# Patient Record
Sex: Female | Born: 1955
Health system: Southern US, Community
[De-identification: ages and names within clinical notes are randomized; demographics above are authoritative.]

---

## 2010-12-05 ENCOUNTER — Ambulatory Visit: Payer: Self-pay | Admitting: Unknown Physician Specialty

## 2010-12-14 ENCOUNTER — Ambulatory Visit: Payer: Self-pay | Admitting: Unknown Physician Specialty

## 2010-12-14 ENCOUNTER — Ambulatory Visit: Payer: Self-pay | Admitting: Oncology

## 2010-12-15 LAB — PATHOLOGY REPORT

## 2010-12-18 ENCOUNTER — Ambulatory Visit: Payer: Self-pay | Admitting: Oncology

## 2010-12-20 ENCOUNTER — Ambulatory Visit: Payer: Self-pay | Admitting: Oncology

## 2011-01-02 ENCOUNTER — Ambulatory Visit: Payer: Self-pay | Admitting: Oncology

## 2011-01-04 ENCOUNTER — Ambulatory Visit: Payer: Self-pay

## 2011-01-10 ENCOUNTER — Ambulatory Visit: Payer: Self-pay | Admitting: Surgery

## 2011-01-16 ENCOUNTER — Ambulatory Visit: Payer: Self-pay | Admitting: Surgery

## 2011-01-18 LAB — COMPREHENSIVE METABOLIC PANEL
Albumin: 4 g/dL (ref 3.4–5.0)
BUN: 9 mg/dL (ref 7–18)
Bilirubin,Total: 0.3 mg/dL (ref 0.2–1.0)
Chloride: 104 mmol/L (ref 98–107)
EGFR (African American): >60
Potassium: 3.6 mmol/L (ref 3.5–5.1)
SGOT(AST): 17 U/L (ref 15–37)
SGPT (ALT): 23 U/L
Sodium: 142 mmol/L (ref 136–145)
Total Protein: 7.4 g/dL (ref 6.4–8.2)

## 2011-01-18 LAB — CBC CANCER CENTER
Basophil %: 0.5 %
Eosinophil %: 1 %
HCT: 32.2 % — ABNORMAL LOW (ref 35.0–47.0)
HGB: 10.6 g/dL — ABNORMAL LOW (ref 12.0–16.0)
Lymphocyte #: 1.7 x10 3/mm (ref 1.0–3.6)
MCH: 25.2 pg — ABNORMAL LOW (ref 26.0–34.0)
MCV: 77 fL — ABNORMAL LOW (ref 80–100)
Monocyte #: 0.3 x10 3/mm (ref 0.0–0.7)
Neutrophil #: 2.1 x10 3/mm (ref 1.4–6.5)
RBC: 4.2 10*6/uL (ref 3.80–5.20)
WBC: 4.1 x10 3/mm (ref 3.6–11.0)

## 2011-01-25 LAB — CBC CANCER CENTER
Basophil %: 0.2 %
Eosinophil %: 2 %
HGB: 10.4 g/dL — ABNORMAL LOW (ref 12.0–16.0)
Lymphocyte #: 0.9 x10 3/mm — ABNORMAL LOW (ref 1.0–3.6)
MCH: 25 pg — ABNORMAL LOW (ref 26.0–34.0)
MCV: 76 fL — ABNORMAL LOW (ref 80–100)
Monocyte #: 0.2 x10 3/mm (ref 0.0–0.7)
RBC: 4.16 10*6/uL (ref 3.80–5.20)
RDW: 14.5 % (ref 11.5–14.5)

## 2011-01-31 LAB — CBC CANCER CENTER
Basophil %: 0.3 %
Eosinophil %: 4.7 %
Lymphocyte %: 22.1 %
MCH: 25.8 pg — ABNORMAL LOW (ref 26.0–34.0)
Monocyte %: 6.9 %
Neutrophil %: 66 %
RBC: 4.17 10*6/uL (ref 3.80–5.20)

## 2011-02-02 ENCOUNTER — Ambulatory Visit: Payer: Self-pay | Admitting: Oncology

## 2011-02-05 LAB — COMPREHENSIVE METABOLIC PANEL
Albumin: 3.7 g/dL (ref 3.4–5.0)
Anion Gap: 9 (ref 7–16)
BUN: 11 mg/dL (ref 7–18)
Bilirubin,Total: 0.3 mg/dL (ref 0.2–1.0)
Chloride: 101 mmol/L (ref 98–107)
Creatinine: 0.67 mg/dL (ref 0.60–1.30)
EGFR (African American): >60
Osmolality: 279 (ref 275–301)
Potassium: 3.8 mmol/L (ref 3.5–5.1)
SGPT (ALT): 32 U/L
Sodium: 140 mmol/L (ref 136–145)
Total Protein: 7.4 g/dL (ref 6.4–8.2)

## 2011-02-05 LAB — CBC CANCER CENTER
Basophil #: 0 x10 3/mm (ref 0.0–0.1)
Eosinophil %: 9.4 %
HCT: 32 % — ABNORMAL LOW (ref 35.0–47.0)
Lymphocyte #: 0.6 x10 3/mm — ABNORMAL LOW (ref 1.0–3.6)
Lymphocyte %: 13.2 %
MCV: 77 fL — ABNORMAL LOW (ref 80–100)
Monocyte %: 5.2 %
Neutrophil #: 3.4 x10 3/mm (ref 1.4–6.5)
Neutrophil %: 72 %
Platelet: 208 x10 3/mm (ref 150–440)
RBC: 4.17 10*6/uL (ref 3.80–5.20)
RDW: 14.8 % — ABNORMAL HIGH (ref 11.5–14.5)

## 2011-02-12 LAB — CBC CANCER CENTER
Basophil #: 0 x10 3/mm (ref 0.0–0.1)
Basophil %: 0.3 %
Eosinophil #: 0.6 x10 3/mm (ref 0.0–0.7)
HCT: 30.8 % — ABNORMAL LOW (ref 35.0–47.0)
HGB: 10.1 g/dL — ABNORMAL LOW (ref 12.0–16.0)
Lymphocyte #: 0.6 x10 3/mm — ABNORMAL LOW (ref 1.0–3.6)
MCH: 25.3 pg — ABNORMAL LOW (ref 26.0–34.0)
MCHC: 32.9 g/dL (ref 32.0–36.0)
MCV: 77 fL — ABNORMAL LOW (ref 80–100)
Monocyte #: 0.3 x10 3/mm (ref 0.0–0.7)
Neutrophil #: 2.9 x10 3/mm (ref 1.4–6.5)
Neutrophil %: 66.4 %
RDW: 15.2 % — ABNORMAL HIGH (ref 11.5–14.5)

## 2011-02-12 LAB — BASIC METABOLIC PANEL
BUN: 9 mg/dL (ref 7–18)
Calcium, Total: 8.6 mg/dL (ref 8.5–10.1)
Creatinine: 0.67 mg/dL (ref 0.60–1.30)
EGFR (African American): >60
EGFR (Non-African Amer.): >60
Glucose: 93 mg/dL (ref 65–99)
Potassium: 3.8 mmol/L (ref 3.5–5.1)
Sodium: 140 mmol/L (ref 136–145)

## 2011-02-19 LAB — CBC CANCER CENTER
Basophil %: 0.1 %
Eosinophil #: 0.6 x10 3/mm (ref 0.0–0.7)
Eosinophil %: 15.8 %
HCT: 32 % — ABNORMAL LOW (ref 35.0–47.0)
HGB: 10.6 g/dL — ABNORMAL LOW (ref 12.0–16.0)
Lymphocyte %: 10.5 %
MCV: 77 fL — ABNORMAL LOW (ref 80–100)
Monocyte %: 6.9 %
Neutrophil #: 2.4 x10 3/mm (ref 1.4–6.5)
Neutrophil %: 66.7 %
Platelet: 246 x10 3/mm (ref 150–440)
RBC: 4.18 10*6/uL (ref 3.80–5.20)
WBC: 3.5 x10 3/mm — ABNORMAL LOW (ref 3.6–11.0)

## 2011-02-22 LAB — CBC CANCER CENTER
Basophil #: 0 x10 3/mm (ref 0.0–0.1)
Lymphocyte #: 0.3 x10 3/mm — ABNORMAL LOW (ref 1.0–3.6)
MCV: 77 fL — ABNORMAL LOW (ref 80–100)
Monocyte #: 0.4 x10 3/mm (ref 0.0–0.7)
Monocyte %: 6.7 %
Neutrophil %: 78.5 %
Platelet: 230 x10 3/mm (ref 150–440)
RBC: 4.07 10*6/uL (ref 3.80–5.20)
RDW: 16 % — ABNORMAL HIGH (ref 11.5–14.5)
WBC: 5.3 x10 3/mm (ref 3.6–11.0)

## 2011-02-26 LAB — COMPREHENSIVE METABOLIC PANEL
Alkaline Phosphatase: 69 U/L (ref 50–136)
Anion Gap: 8 (ref 7–16)
BUN: 8 mg/dL (ref 7–18)
Calcium, Total: 8.7 mg/dL (ref 8.5–10.1)
Chloride: 102 mmol/L (ref 98–107)
Co2: 31 mmol/L (ref 21–32)
Creatinine: 0.65 mg/dL (ref 0.60–1.30)
EGFR (African American): >60
EGFR (Non-African Amer.): >60
Osmolality: 280 (ref 275–301)
SGOT(AST): 12 U/L — ABNORMAL LOW (ref 15–37)
SGPT (ALT): 20 U/L
Sodium: 141 mmol/L (ref 136–145)

## 2011-02-26 LAB — CBC CANCER CENTER
Basophil %: 0.1 %
Eosinophil #: 0.3 x10 3/mm (ref 0.0–0.7)
Eosinophil %: 7.8 %
HGB: 10.2 g/dL — ABNORMAL LOW (ref 12.0–16.0)
Lymphocyte %: 7.5 %
MCH: 25.4 pg — ABNORMAL LOW (ref 26.0–34.0)
MCV: 77 fL — ABNORMAL LOW (ref 80–100)
Monocyte #: 0.5 x10 3/mm (ref 0.0–0.7)
Monocyte %: 11.4 %
Neutrophil %: 73.2 %
Platelet: 227 x10 3/mm (ref 150–440)
RBC: 4.04 10*6/uL (ref 3.80–5.20)
WBC: 4.1 x10 3/mm (ref 3.6–11.0)

## 2011-03-02 ENCOUNTER — Ambulatory Visit: Payer: Self-pay | Admitting: Oncology

## 2011-03-26 LAB — COMPREHENSIVE METABOLIC PANEL
Albumin: 3.8 g/dL (ref 3.4–5.0)
Alkaline Phosphatase: 62 U/L (ref 50–136)
BUN: 13 mg/dL (ref 7–18)
Bilirubin,Total: 0.3 mg/dL (ref 0.2–1.0)
Calcium, Total: 9 mg/dL (ref 8.5–10.1)
Co2: 30 mmol/L (ref 21–32)
EGFR (African American): >60
Osmolality: 281 (ref 275–301)
Potassium: 3.8 mmol/L (ref 3.5–5.1)
SGPT (ALT): 32 U/L
Sodium: 141 mmol/L (ref 136–145)
Total Protein: 7.5 g/dL (ref 6.4–8.2)

## 2011-03-26 LAB — CBC CANCER CENTER
Eosinophil #: 0.3 x10 3/mm (ref 0.0–0.7)
HCT: 31.3 % — ABNORMAL LOW (ref 35.0–47.0)
HGB: 10.4 g/dL — ABNORMAL LOW (ref 12.0–16.0)
Lymphocyte #: 0.6 x10 3/mm — ABNORMAL LOW (ref 1.0–3.6)
MCH: 25.7 pg — ABNORMAL LOW (ref 26.0–34.0)
Neutrophil %: 63.4 %
RBC: 4.02 10*6/uL (ref 3.80–5.20)
RDW: 16.3 % — ABNORMAL HIGH (ref 11.5–14.5)
WBC: 3.2 x10 3/mm — ABNORMAL LOW (ref 3.6–11.0)

## 2011-03-27 LAB — CEA: CEA: 2.9 ng/mL (ref 0.0–4.7)

## 2011-04-02 ENCOUNTER — Ambulatory Visit: Payer: Self-pay | Admitting: Oncology

## 2011-05-02 ENCOUNTER — Ambulatory Visit: Payer: Self-pay | Admitting: Oncology

## 2011-05-15 ENCOUNTER — Ambulatory Visit: Payer: Self-pay | Admitting: Surgery

## 2011-05-15 LAB — CBC WITH DIFFERENTIAL/PLATELET
Basophil #: 0 10*3/uL (ref 0.0–0.1)
Eosinophil #: 0.1 10*3/uL (ref 0.0–0.7)
Eosinophil %: 2.1 %
HCT: 33.6 % — ABNORMAL LOW (ref 35.0–47.0)
Lymphocyte %: 20.4 %
MCH: 25.7 pg — ABNORMAL LOW (ref 26.0–34.0)
Monocyte #: 0.2 x10 3/mm (ref 0.2–0.9)
Neutrophil %: 70 %
Platelet: 231 10*3/uL (ref 150–440)
RBC: 4.2 10*6/uL (ref 3.80–5.20)
RDW: 15 % — ABNORMAL HIGH (ref 11.5–14.5)
WBC: 3 10*3/uL — ABNORMAL LOW (ref 3.6–11.0)

## 2011-05-15 LAB — BASIC METABOLIC PANEL
Anion Gap: 6 — ABNORMAL LOW (ref 7–16)
BUN: 14 mg/dL (ref 7–18)
Calcium, Total: 8.8 mg/dL (ref 8.5–10.1)
Chloride: 105 mmol/L (ref 98–107)
Co2: 30 mmol/L (ref 21–32)
Creatinine: 0.63 mg/dL (ref 0.60–1.30)
EGFR (African American): >60
EGFR (Non-African Amer.): >60
Glucose: 86 mg/dL (ref 65–99)
Osmolality: 281 (ref 275–301)
Potassium: 4.1 mmol/L (ref 3.5–5.1)
Sodium: 141 mmol/L (ref 136–145)

## 2011-05-15 LAB — APTT: Activated PTT: 30.7 secs (ref 23.6–35.9)

## 2011-05-15 LAB — HEPATIC FUNCTION PANEL A (ARMC)
Albumin: 4 g/dL (ref 3.4–5.0)
Alkaline Phosphatase: 53 U/L (ref 50–136)
Bilirubin,Total: 0.3 mg/dL (ref 0.2–1.0)
SGOT(AST): 24 U/L (ref 15–37)
SGPT (ALT): 29 U/L
Total Protein: 7.6 g/dL (ref 6.4–8.2)

## 2011-05-15 LAB — PROTIME-INR: INR: 0.9

## 2011-05-22 ENCOUNTER — Inpatient Hospital Stay: Payer: Self-pay | Admitting: Surgery

## 2011-05-23 LAB — CBC WITH DIFFERENTIAL/PLATELET
Basophil %: 0.1 %
Eosinophil #: 0 10*3/uL (ref 0.0–0.7)
Eosinophil %: 0 %
Lymphocyte #: 0.3 10*3/uL — ABNORMAL LOW (ref 1.0–3.6)
Lymphocyte %: 7.5 %
MCHC: 33 g/dL (ref 32.0–36.0)
MCV: 79 fL — ABNORMAL LOW (ref 80–100)
Neutrophil #: 3.9 10*3/uL (ref 1.4–6.5)
Neutrophil %: 84.4 %
Platelet: 217 10*3/uL (ref 150–440)
RBC: 3.64 10*6/uL — ABNORMAL LOW (ref 3.80–5.20)
RDW: 14.4 % (ref 11.5–14.5)

## 2011-05-23 LAB — COMPREHENSIVE METABOLIC PANEL
Albumin: 3.1 g/dL — ABNORMAL LOW (ref 3.4–5.0)
Alkaline Phosphatase: 42 U/L — ABNORMAL LOW (ref 50–136)
Anion Gap: 9 (ref 7–16)
BUN: 9 mg/dL (ref 7–18)
Bilirubin,Total: 0.5 mg/dL (ref 0.2–1.0)
Calcium, Total: 8.1 mg/dL — ABNORMAL LOW (ref 8.5–10.1)
Co2: 24 mmol/L (ref 21–32)
Creatinine: 0.93 mg/dL (ref 0.60–1.30)
EGFR (African American): >60
EGFR (Non-African Amer.): >60
Glucose: 124 mg/dL — ABNORMAL HIGH (ref 65–99)
Osmolality: 281 (ref 275–301)
Potassium: 4.1 mmol/L (ref 3.5–5.1)
SGOT(AST): 19 U/L (ref 15–37)
Sodium: 141 mmol/L (ref 136–145)
Total Protein: 6.1 g/dL — ABNORMAL LOW (ref 6.4–8.2)

## 2011-05-26 LAB — CBC WITH DIFFERENTIAL/PLATELET
Basophil #: 0 10*3/uL (ref 0.0–0.1)
Basophil %: 0.6 %
HCT: 26 % — ABNORMAL LOW (ref 35.0–47.0)
HGB: 8.3 g/dL — ABNORMAL LOW (ref 12.0–16.0)
Lymphocyte #: 0.5 10*3/uL — ABNORMAL LOW (ref 1.0–3.6)
Lymphocyte %: 18.3 %
MCH: 25.4 pg — ABNORMAL LOW (ref 26.0–34.0)
MCHC: 31.9 g/dL — ABNORMAL LOW (ref 32.0–36.0)
Monocyte %: 8.2 %
Neutrophil %: 68.5 %
Platelet: 197 10*3/uL (ref 150–440)
WBC: 2.6 10*3/uL — ABNORMAL LOW (ref 3.6–11.0)

## 2011-05-28 LAB — CREATININE, SERUM
EGFR (African American): >60
EGFR (Non-African Amer.): >60

## 2011-05-28 LAB — PATHOLOGY REPORT

## 2011-06-14 ENCOUNTER — Ambulatory Visit: Payer: Self-pay | Admitting: Oncology

## 2011-06-19 LAB — COMPREHENSIVE METABOLIC PANEL
Albumin: 4.2 g/dL (ref 3.4–5.0)
Alkaline Phosphatase: 67 U/L (ref 50–136)
Anion Gap: 8 (ref 7–16)
BUN: 26 mg/dL — ABNORMAL HIGH (ref 7–18)
Calcium, Total: 8.9 mg/dL (ref 8.5–10.1)
Chloride: 91 mmol/L — ABNORMAL LOW (ref 98–107)
Co2: 21 mmol/L (ref 21–32)
Osmolality: 247 (ref 275–301)
Potassium: 5.6 mmol/L — ABNORMAL HIGH (ref 3.5–5.1)
SGOT(AST): 14 U/L — ABNORMAL LOW (ref 15–37)
SGPT (ALT): 20 U/L

## 2011-06-19 LAB — CBC CANCER CENTER
Basophil #: 0 x10 3/mm (ref 0.0–0.1)
Basophil %: 0.8 %
Eosinophil %: 5.4 %
HGB: 9.3 g/dL — ABNORMAL LOW (ref 12.0–16.0)
Lymphocyte %: 21.7 %
MCHC: 32.6 g/dL (ref 32.0–36.0)
MCV: 76 fL — ABNORMAL LOW (ref 80–100)
Monocyte #: 0.3 x10 3/mm (ref 0.2–0.9)
Monocyte %: 8.5 %
Neutrophil #: 1.9 x10 3/mm (ref 1.4–6.5)
Platelet: 266 x10 3/mm (ref 150–440)
RDW: 13.5 % (ref 11.5–14.5)
WBC: 3 x10 3/mm — ABNORMAL LOW (ref 3.6–11.0)

## 2011-06-20 LAB — CEA: CEA: 1.9 ng/mL (ref 0.0–4.7)

## 2011-06-26 LAB — COMPREHENSIVE METABOLIC PANEL
Albumin: 4.2 g/dL (ref 3.4–5.0)
Alkaline Phosphatase: 60 U/L (ref 50–136)
Anion Gap: 10 (ref 7–16)
BUN: 20 mg/dL — ABNORMAL HIGH (ref 7–18)
Bilirubin,Total: 0.2 mg/dL (ref 0.2–1.0)
Chloride: 94 mmol/L — ABNORMAL LOW (ref 98–107)
Co2: 21 mmol/L (ref 21–32)
Creatinine: 1.22 mg/dL (ref 0.60–1.30)
EGFR (Non-African Amer.): 51 — ABNORMAL LOW
Osmolality: 254 (ref 275–301)
Potassium: 4.8 mmol/L (ref 3.5–5.1)
SGPT (ALT): 20 U/L
Total Protein: 8 g/dL (ref 6.4–8.2)

## 2011-06-26 LAB — CBC CANCER CENTER
Basophil #: 0 x10 3/mm (ref 0.0–0.1)
Lymphocyte #: 0.7 x10 3/mm — ABNORMAL LOW (ref 1.0–3.6)
Lymphocyte %: 23.8 %
MCH: 25 pg — ABNORMAL LOW (ref 26.0–34.0)
MCHC: 32.2 g/dL (ref 32.0–36.0)
Monocyte #: 0.2 x10 3/mm (ref 0.2–0.9)
Neutrophil #: 1.8 x10 3/mm (ref 1.4–6.5)
Platelet: 229 x10 3/mm (ref 150–440)
RBC: 3.51 10*6/uL — ABNORMAL LOW (ref 3.80–5.20)
RDW: 13.4 % (ref 11.5–14.5)

## 2011-07-02 ENCOUNTER — Ambulatory Visit: Payer: Self-pay | Admitting: Oncology

## 2011-07-05 ENCOUNTER — Inpatient Hospital Stay: Payer: Self-pay | Admitting: Family Medicine

## 2011-07-05 LAB — CBC WITH DIFFERENTIAL/PLATELET
Basophil %: 0.2 %
Eosinophil #: 0.1 10*3/uL (ref 0.0–0.7)
Eosinophil %: 2.1 %
HCT: 30.4 % — ABNORMAL LOW (ref 35.0–47.0)
HGB: 10.4 g/dL — ABNORMAL LOW (ref 12.0–16.0)
Lymphocyte %: 18.6 %
MCH: 25.2 pg — ABNORMAL LOW (ref 26.0–34.0)
MCHC: 34.1 g/dL (ref 32.0–36.0)
Monocyte #: 0.2 x10 3/mm (ref 0.2–0.9)
Monocyte %: 4.5 %
Neutrophil #: 3.2 10*3/uL (ref 1.4–6.5)
Neutrophil %: 74.6 %
Platelet: 333 10*3/uL (ref 150–440)
RBC: 4.12 10*6/uL (ref 3.80–5.20)

## 2011-07-05 LAB — BASIC METABOLIC PANEL
BUN: 16 mg/dL (ref 7–18)
Calcium, Total: 8.4 mg/dL — ABNORMAL LOW (ref 8.5–10.1)
EGFR (African American): 52 — ABNORMAL LOW
EGFR (Non-African Amer.): 45 — ABNORMAL LOW
Glucose: 117 mg/dL — ABNORMAL HIGH (ref 65–99)
Osmolality: 256 (ref 275–301)
Sodium: 126 mmol/L — ABNORMAL LOW (ref 136–145)

## 2011-07-05 LAB — COMPREHENSIVE METABOLIC PANEL
Albumin: 5 g/dL (ref 3.4–5.0)
Anion Gap: 11 (ref 7–16)
BUN: 23 mg/dL — ABNORMAL HIGH (ref 7–18)
Bilirubin,Total: 0.4 mg/dL (ref 0.2–1.0)
Calcium, Total: 9.6 mg/dL (ref 8.5–10.1)
Creatinine: 1.64 mg/dL — ABNORMAL HIGH (ref 0.60–1.30)
EGFR (Non-African Amer.): 35 — ABNORMAL LOW
Glucose: 96 mg/dL (ref 65–99)
Osmolality: 236 (ref 275–301)
Potassium: 5 mmol/L (ref 3.5–5.1)
SGPT (ALT): 24 U/L
Sodium: 115 mmol/L — CL (ref 136–145)
Total Protein: 9.2 g/dL — ABNORMAL HIGH (ref 6.4–8.2)

## 2011-07-05 LAB — URINALYSIS, COMPLETE
Ketone: NEGATIVE
Nitrite: NEGATIVE
Ph: 6 (ref 4.5–8.0)
Protein: NEGATIVE
RBC,UR: <1 /HPF (ref 0–5)
Squamous Epithelial: <1

## 2011-07-05 LAB — MAGNESIUM: Magnesium: 1.4 mg/dL — ABNORMAL LOW

## 2011-07-05 LAB — SODIUM: Sodium: 130 mmol/L — ABNORMAL LOW (ref 136–145)

## 2011-07-05 LAB — LIPASE, BLOOD: Lipase: 488 U/L — ABNORMAL HIGH (ref 73–393)

## 2011-07-06 LAB — CBC WITH DIFFERENTIAL/PLATELET
Basophil %: 0.6 %
Eosinophil #: 0 10*3/uL (ref 0.0–0.7)
HGB: 7 g/dL — ABNORMAL LOW (ref 12.0–16.0)
Lymphocyte %: 24.1 %
MCV: 77 fL — ABNORMAL LOW (ref 80–100)
Monocyte #: 0.2 x10 3/mm (ref 0.2–0.9)
Neutrophil %: 62.1 %
Platelet: 246 10*3/uL (ref 150–440)
RBC: 2.72 10*6/uL — ABNORMAL LOW (ref 3.80–5.20)
WBC: 1.8 10*3/uL — CL (ref 3.6–11.0)

## 2011-07-06 LAB — BASIC METABOLIC PANEL
Anion Gap: 9 (ref 7–16)
Calcium, Total: 7.5 mg/dL — ABNORMAL LOW (ref 8.5–10.1)
Co2: 21 mmol/L (ref 21–32)
EGFR (African American): >60
EGFR (Non-African Amer.): >60
Glucose: 75 mg/dL (ref 65–99)
Osmolality: 275 (ref 275–301)
Sodium: 139 mmol/L (ref 136–145)

## 2011-07-06 LAB — HEMOGLOBIN: HGB: 6.8 g/dL — ABNORMAL LOW (ref 12.0–16.0)

## 2011-07-06 LAB — LIPASE, BLOOD: Lipase: 373 U/L (ref 73–393)

## 2011-07-07 LAB — CBC WITH DIFFERENTIAL/PLATELET
Basophil #: 0 10*3/uL (ref 0.0–0.1)
HCT: 25.9 % — ABNORMAL LOW (ref 35.0–47.0)
Lymphocyte #: 0.4 10*3/uL — ABNORMAL LOW (ref 1.0–3.6)
Lymphocyte %: 20.3 %
MCH: 26.2 pg (ref 26.0–34.0)
MCHC: 33.5 g/dL (ref 32.0–36.0)
MCV: 78 fL — ABNORMAL LOW (ref 80–100)
Monocyte #: 0.3 x10 3/mm (ref 0.2–0.9)
Monocyte %: 12 %
Neutrophil #: 1.3 10*3/uL — ABNORMAL LOW (ref 1.4–6.5)
RDW: 14.1 % (ref 11.5–14.5)
WBC: 2.1 10*3/uL — ABNORMAL LOW (ref 3.6–11.0)

## 2011-07-07 LAB — BASIC METABOLIC PANEL
Calcium, Total: 7.8 mg/dL — ABNORMAL LOW (ref 8.5–10.1)
Chloride: 109 mmol/L — ABNORMAL HIGH (ref 98–107)
Co2: 19 mmol/L — ABNORMAL LOW (ref 21–32)
Creatinine: 0.85 mg/dL (ref 0.60–1.30)
Osmolality: 273 (ref 275–301)
Potassium: 3.9 mmol/L (ref 3.5–5.1)

## 2011-07-08 LAB — CBC WITH DIFFERENTIAL/PLATELET
Basophil %: 0.8 %
Eosinophil #: 0.1 10*3/uL (ref 0.0–0.7)
Eosinophil %: 6.3 %
HCT: 24.9 % — ABNORMAL LOW (ref 35.0–47.0)
Lymphocyte %: 33.2 %
Monocyte #: 0.3 x10 3/mm (ref 0.2–0.9)
Monocyte %: 15.7 %
Neutrophil #: 0.7 10*3/uL — ABNORMAL LOW (ref 1.4–6.5)
RDW: 14.2 % (ref 11.5–14.5)
WBC: 1.6 10*3/uL — CL (ref 3.6–11.0)

## 2011-07-08 LAB — BASIC METABOLIC PANEL
Anion Gap: 10 (ref 7–16)
BUN: 3 mg/dL — ABNORMAL LOW (ref 7–18)
Creatinine: 0.87 mg/dL (ref 0.60–1.30)
EGFR (African American): >60
EGFR (Non-African Amer.): >60
Glucose: 71 mg/dL (ref 65–99)
Osmolality: 273 (ref 275–301)

## 2011-07-10 LAB — BASIC METABOLIC PANEL
BUN: 3 mg/dL — ABNORMAL LOW (ref 7–18)
Co2: 23 mmol/L (ref 21–32)
Creatinine: 0.97 mg/dL (ref 0.60–1.30)
EGFR (African American): >60
EGFR (Non-African Amer.): >60
Glucose: 82 mg/dL (ref 65–99)
Osmolality: 275 (ref 275–301)
Sodium: 140 mmol/L (ref 136–145)

## 2011-07-10 LAB — CBC CANCER CENTER
Basophil #: 0 x10 3/mm (ref 0.0–0.1)
Basophil %: 0.9 %
Eosinophil #: 0.1 x10 3/mm (ref 0.0–0.7)
Eosinophil %: 5.1 %
HCT: 25.8 % — ABNORMAL LOW (ref 35.0–47.0)
Lymphocyte #: 0.6 x10 3/mm — ABNORMAL LOW (ref 1.0–3.6)
Lymphocyte %: 23.4 %
MCH: 26 pg (ref 26.0–34.0)
MCHC: 33.4 g/dL (ref 32.0–36.0)
Monocyte %: 10.8 %
Neutrophil %: 59.8 %
Platelet: 228 x10 3/mm (ref 150–440)
RDW: 14.3 % (ref 11.5–14.5)

## 2011-07-24 LAB — COMPREHENSIVE METABOLIC PANEL
Albumin: 3.7 g/dL (ref 3.4–5.0)
Anion Gap: 12 (ref 7–16)
BUN: 20 mg/dL — ABNORMAL HIGH (ref 7–18)
Calcium, Total: 9.1 mg/dL (ref 8.5–10.1)
Co2: 22 mmol/L (ref 21–32)
Creatinine: 1.21 mg/dL (ref 0.60–1.30)
EGFR (Non-African Amer.): 51 — ABNORMAL LOW
Glucose: 93 mg/dL (ref 65–99)
Osmolality: 282 (ref 275–301)
SGOT(AST): 15 U/L (ref 15–37)
SGPT (ALT): 30 U/L
Sodium: 140 mmol/L (ref 136–145)
Total Protein: 7.5 g/dL (ref 6.4–8.2)

## 2011-07-24 LAB — CBC CANCER CENTER
Basophil #: 0 x10 3/mm (ref 0.0–0.1)
Eosinophil #: 0.2 x10 3/mm (ref 0.0–0.7)
Eosinophil %: 4.8 %
HCT: 29.4 % — ABNORMAL LOW (ref 35.0–47.0)
Lymphocyte #: 0.6 x10 3/mm — ABNORMAL LOW (ref 1.0–3.6)
Lymphocyte %: 19.2 %
MCH: 25.9 pg — ABNORMAL LOW (ref 26.0–34.0)
MCHC: 32.6 g/dL (ref 32.0–36.0)
MCV: 79 fL — ABNORMAL LOW (ref 80–100)
Monocyte #: 0.3 x10 3/mm (ref 0.2–0.9)
Neutrophil #: 2.2 x10 3/mm (ref 1.4–6.5)
RBC: 3.7 10*6/uL — ABNORMAL LOW (ref 3.80–5.20)
RDW: 14.9 % — ABNORMAL HIGH (ref 11.5–14.5)

## 2011-07-24 LAB — FERRITIN: Ferritin (ARMC): 231 ng/mL (ref 8–388)

## 2011-07-24 LAB — IRON AND TIBC: Iron Bind.Cap.(Total): 312 ug/dL (ref 250–450)

## 2011-08-02 ENCOUNTER — Ambulatory Visit: Payer: Self-pay | Admitting: Oncology

## 2011-08-07 LAB — CBC CANCER CENTER
Basophil %: 0.6 %
Eosinophil %: 5 %
HCT: 27.3 % — ABNORMAL LOW (ref 35.0–47.0)
HGB: 9.3 g/dL — ABNORMAL LOW (ref 12.0–16.0)
MCH: 26.8 pg (ref 26.0–34.0)
MCHC: 33.9 g/dL (ref 32.0–36.0)
MCV: 79 fL — ABNORMAL LOW (ref 80–100)
Neutrophil %: 69.5 %
RBC: 3.46 10*6/uL — ABNORMAL LOW (ref 3.80–5.20)
WBC: 4.5 x10 3/mm (ref 3.6–11.0)

## 2011-08-07 LAB — COMPREHENSIVE METABOLIC PANEL
Albumin: 3.6 g/dL (ref 3.4–5.0)
Alkaline Phosphatase: 66 U/L (ref 50–136)
Anion Gap: 10 (ref 7–16)
BUN: 17 mg/dL (ref 7–18)
Bilirubin,Total: 0.2 mg/dL (ref 0.2–1.0)
Calcium, Total: 8.9 mg/dL (ref 8.5–10.1)
Chloride: 107 mmol/L (ref 98–107)
EGFR (African American): >60
Osmolality: 281 (ref 275–301)
Potassium: 3.9 mmol/L (ref 3.5–5.1)
SGOT(AST): 18 U/L (ref 15–37)
Sodium: 140 mmol/L (ref 136–145)
Total Protein: 7.3 g/dL (ref 6.4–8.2)

## 2011-08-21 LAB — CBC CANCER CENTER
Basophil #: 0 x10 3/mm (ref 0.0–0.1)
Basophil %: 0.7 %
Eosinophil #: 0.2 x10 3/mm (ref 0.0–0.7)
HGB: 9 g/dL — ABNORMAL LOW (ref 12.0–16.0)
Lymphocyte %: 23.5 %
MCH: 26.3 pg (ref 26.0–34.0)
MCHC: 32.8 g/dL (ref 32.0–36.0)
MCV: 80 fL (ref 80–100)
Monocyte #: 0.4 x10 3/mm (ref 0.2–0.9)
Neutrophil #: 2.8 x10 3/mm (ref 1.4–6.5)
Neutrophil %: 62.3 %
Platelet: 226 x10 3/mm (ref 150–440)
RBC: 3.44 10*6/uL — ABNORMAL LOW (ref 3.80–5.20)
RDW: 17 % — ABNORMAL HIGH (ref 11.5–14.5)
WBC: 4.4 x10 3/mm (ref 3.6–11.0)

## 2011-08-21 LAB — COMPREHENSIVE METABOLIC PANEL
Alkaline Phosphatase: 76 U/L (ref 50–136)
BUN: 19 mg/dL — ABNORMAL HIGH (ref 7–18)
Bilirubin,Total: 0.3 mg/dL (ref 0.2–1.0)
Chloride: 104 mmol/L (ref 98–107)
Co2: 19 mmol/L — ABNORMAL LOW (ref 21–32)
Creatinine: 1.54 mg/dL — ABNORMAL HIGH (ref 0.60–1.30)
EGFR (Non-African Amer.): 38 — ABNORMAL LOW
Osmolality: 276 (ref 275–301)
Sodium: 137 mmol/L (ref 136–145)
Total Protein: 7.5 g/dL (ref 6.4–8.2)

## 2011-08-21 LAB — MAGNESIUM: Magnesium: 1.5 mg/dL — ABNORMAL LOW

## 2011-09-02 ENCOUNTER — Ambulatory Visit: Payer: Self-pay | Admitting: Oncology

## 2011-09-11 LAB — COMPREHENSIVE METABOLIC PANEL
Anion Gap: 11 (ref 7–16)
BUN: 27 mg/dL — ABNORMAL HIGH (ref 7–18)
Bilirubin,Total: 0.2 mg/dL (ref 0.2–1.0)
Chloride: 111 mmol/L — ABNORMAL HIGH (ref 98–107)
Creatinine: 1.29 mg/dL (ref 0.60–1.30)
EGFR (African American): 55 — ABNORMAL LOW
Osmolality: 286 (ref 275–301)
Potassium: 3.6 mmol/L (ref 3.5–5.1)
Sodium: 140 mmol/L (ref 136–145)
Total Protein: 7.2 g/dL (ref 6.4–8.2)

## 2011-09-11 LAB — CBC CANCER CENTER
Basophil #: 0 x10 3/mm (ref 0.0–0.1)
HCT: 25.7 % — ABNORMAL LOW (ref 35.0–47.0)
HGB: 8.6 g/dL — ABNORMAL LOW (ref 12.0–16.0)
Lymphocyte %: 26.7 %
MCH: 27.8 pg (ref 26.0–34.0)
MCHC: 33.7 g/dL (ref 32.0–36.0)
MCV: 82 fL (ref 80–100)
Monocyte #: 0.4 x10 3/mm (ref 0.2–0.9)
Monocyte %: 17.3 %
Neutrophil #: 1.2 x10 3/mm — ABNORMAL LOW (ref 1.4–6.5)
RBC: 3.11 10*6/uL — ABNORMAL LOW (ref 3.80–5.20)
RDW: 21.2 % — ABNORMAL HIGH (ref 11.5–14.5)

## 2011-09-11 LAB — MAGNESIUM: Magnesium: 1.8 mg/dL

## 2011-10-02 ENCOUNTER — Ambulatory Visit: Payer: Self-pay | Admitting: Oncology

## 2011-10-02 LAB — CBC CANCER CENTER
Basophil #: 0 x10 3/mm (ref 0.0–0.1)
Eosinophil #: 0.2 x10 3/mm (ref 0.0–0.7)
Eosinophil %: 6 %
HCT: 27.1 % — ABNORMAL LOW (ref 35.0–47.0)
HGB: 8.6 g/dL — ABNORMAL LOW (ref 12.0–16.0)
Lymphocyte #: 0.6 x10 3/mm — ABNORMAL LOW (ref 1.0–3.6)
Lymphocyte %: 22 %
MCHC: 31.6 g/dL — ABNORMAL LOW (ref 32.0–36.0)
MCV: 87 fL (ref 80–100)
Monocyte %: 11.9 %
Neutrophil #: 1.7 x10 3/mm (ref 1.4–6.5)
Neutrophil %: 59 %
Platelet: 206 x10 3/mm (ref 150–440)
RBC: 3.1 10*6/uL — ABNORMAL LOW (ref 3.80–5.20)
RDW: 18.9 % — ABNORMAL HIGH (ref 11.5–14.5)
WBC: 2.9 x10 3/mm — ABNORMAL LOW (ref 3.6–11.0)

## 2011-10-02 LAB — COMPREHENSIVE METABOLIC PANEL
Albumin: 3.6 g/dL (ref 3.4–5.0)
Alkaline Phosphatase: 68 U/L (ref 50–136)
Calcium, Total: 8.9 mg/dL (ref 8.5–10.1)
Co2: 17 mmol/L — ABNORMAL LOW (ref 21–32)
EGFR (Non-African Amer.): 40 — ABNORMAL LOW
Glucose: 148 mg/dL — ABNORMAL HIGH (ref 65–99)
Potassium: 3.6 mmol/L (ref 3.5–5.1)
SGOT(AST): 21 U/L (ref 15–37)
SGPT (ALT): 25 U/L (ref 12–78)
Sodium: 140 mmol/L (ref 136–145)

## 2011-10-02 LAB — MAGNESIUM: Magnesium: 1.9 mg/dL

## 2011-10-23 LAB — CBC CANCER CENTER
Basophil %: 0.7 %
Eosinophil #: 0.1 x10 3/mm (ref 0.0–0.7)
HCT: 29.4 % — ABNORMAL LOW (ref 35.0–47.0)
HGB: 9.5 g/dL — ABNORMAL LOW (ref 12.0–16.0)
Lymphocyte #: 0.6 x10 3/mm — ABNORMAL LOW (ref 1.0–3.6)
Lymphocyte %: 17.1 %
MCH: 28.1 pg (ref 26.0–34.0)
MCHC: 32.3 g/dL (ref 32.0–36.0)
MCV: 87 fL (ref 80–100)
Monocyte %: 14.9 %
Neutrophil #: 2.1 x10 3/mm (ref 1.4–6.5)
RDW: 15.9 % — ABNORMAL HIGH (ref 11.5–14.5)

## 2011-10-23 LAB — COMPREHENSIVE METABOLIC PANEL
Albumin: 3.8 g/dL (ref 3.4–5.0)
Alkaline Phosphatase: 74 U/L (ref 50–136)
Bilirubin,Total: 0.1 mg/dL — ABNORMAL LOW (ref 0.2–1.0)
Calcium, Total: 8.9 mg/dL (ref 8.5–10.1)
Chloride: 104 mmol/L (ref 98–107)
Co2: 18 mmol/L — ABNORMAL LOW (ref 21–32)
Creatinine: 1.7 mg/dL — ABNORMAL HIGH (ref 0.60–1.30)
EGFR (Non-African Amer.): 34 — ABNORMAL LOW
Glucose: 105 mg/dL — ABNORMAL HIGH (ref 65–99)
Osmolality: 276 (ref 275–301)
Potassium: 3.7 mmol/L (ref 3.5–5.1)
Sodium: 136 mmol/L (ref 136–145)
Total Protein: 7.5 g/dL (ref 6.4–8.2)

## 2011-10-23 LAB — MAGNESIUM: Magnesium: 2 mg/dL

## 2011-11-02 ENCOUNTER — Ambulatory Visit: Payer: Self-pay | Admitting: Oncology

## 2011-11-06 LAB — COMPREHENSIVE METABOLIC PANEL
Alkaline Phosphatase: 67 U/L (ref 50–136)
Anion Gap: 15 (ref 7–16)
BUN: 18 mg/dL (ref 7–18)
Bilirubin,Total: <0.1 mg/dL — ABNORMAL LOW (ref 0.2–1.0)
Calcium, Total: 8.6 mg/dL (ref 8.5–10.1)
Chloride: 105 mmol/L (ref 98–107)
Creatinine: 1.57 mg/dL — ABNORMAL HIGH (ref 0.60–1.30)
EGFR (African American): 43 — ABNORMAL LOW
EGFR (Non-African Amer.): 37 — ABNORMAL LOW
Glucose: 116 mg/dL — ABNORMAL HIGH (ref 65–99)
SGOT(AST): 20 U/L (ref 15–37)
SGPT (ALT): 31 U/L (ref 12–78)
Total Protein: 7 g/dL (ref 6.4–8.2)

## 2011-11-06 LAB — CBC CANCER CENTER
Basophil #: 0 x10 3/mm (ref 0.0–0.1)
Basophil %: 0.5 %
Eosinophil #: 0.1 x10 3/mm (ref 0.0–0.7)
Eosinophil %: 3.1 %
HGB: 9.2 g/dL — ABNORMAL LOW (ref 12.0–16.0)
Lymphocyte #: 0.5 x10 3/mm — ABNORMAL LOW (ref 1.0–3.6)
Lymphocyte %: 13.6 %
MCHC: 32.5 g/dL (ref 32.0–36.0)
Monocyte %: 10.1 %
Neutrophil #: 2.5 x10 3/mm (ref 1.4–6.5)
Neutrophil %: 72.7 %
Platelet: 141 x10 3/mm — ABNORMAL LOW (ref 150–440)
RDW: 15.3 % — ABNORMAL HIGH (ref 11.5–14.5)
WBC: 3.4 x10 3/mm — ABNORMAL LOW (ref 3.6–11.0)

## 2011-11-20 LAB — CBC CANCER CENTER
Basophil #: 0 x10 3/mm (ref 0.0–0.1)
Basophil %: 1 %
Eosinophil #: 0.1 x10 3/mm (ref 0.0–0.7)
HGB: 9.2 g/dL — ABNORMAL LOW (ref 12.0–16.0)
Lymphocyte %: 15.8 %
MCH: 28.3 pg (ref 26.0–34.0)
MCHC: 32 g/dL (ref 32.0–36.0)
Monocyte #: 0.4 x10 3/mm (ref 0.2–0.9)
Monocyte %: 12 %
Neutrophil #: 2.3 x10 3/mm (ref 1.4–6.5)
Neutrophil %: 67 %
RDW: 15.5 % — ABNORMAL HIGH (ref 11.5–14.5)

## 2011-11-20 LAB — COMPREHENSIVE METABOLIC PANEL
Albumin: 3.5 g/dL (ref 3.4–5.0)
Alkaline Phosphatase: 67 U/L (ref 50–136)
BUN: 20 mg/dL — ABNORMAL HIGH (ref 7–18)
Bilirubin,Total: 0.1 mg/dL — ABNORMAL LOW (ref 0.2–1.0)
Calcium, Total: 8.9 mg/dL (ref 8.5–10.1)
Glucose: 127 mg/dL — ABNORMAL HIGH (ref 65–99)
Osmolality: 284 (ref 275–301)
SGOT(AST): 25 U/L (ref 15–37)
Sodium: 140 mmol/L (ref 136–145)

## 2011-12-02 ENCOUNTER — Ambulatory Visit: Payer: Self-pay | Admitting: Oncology

## 2011-12-13 LAB — COMPREHENSIVE METABOLIC PANEL
Albumin: 3.8 g/dL (ref 3.4–5.0)
Alkaline Phosphatase: 70 U/L (ref 50–136)
Anion Gap: 13 (ref 7–16)
Calcium, Total: 8.9 mg/dL (ref 8.5–10.1)
Co2: 20 mmol/L — ABNORMAL LOW (ref 21–32)
EGFR (African American): 34 — ABNORMAL LOW
EGFR (Non-African Amer.): 29 — ABNORMAL LOW
Osmolality: 282 (ref 275–301)
Potassium: 3.6 mmol/L (ref 3.5–5.1)
SGOT(AST): 20 U/L (ref 15–37)
SGPT (ALT): 23 U/L (ref 12–78)
Sodium: 140 mmol/L (ref 136–145)

## 2011-12-13 LAB — CBC CANCER CENTER
Basophil #: 0 x10 3/mm (ref 0.0–0.1)
Eosinophil #: 0.1 x10 3/mm (ref 0.0–0.7)
Eosinophil %: 4 %
HCT: 28.6 % — ABNORMAL LOW (ref 35.0–47.0)
Lymphocyte #: 0.6 x10 3/mm — ABNORMAL LOW (ref 1.0–3.6)
MCH: 28.8 pg (ref 26.0–34.0)
MCHC: 33.6 g/dL (ref 32.0–36.0)
MCV: 86 fL (ref 80–100)
Monocyte #: 0.4 x10 3/mm (ref 0.2–0.9)
Neutrophil #: 0.9 x10 3/mm — ABNORMAL LOW (ref 1.4–6.5)
Neutrophil %: 44.2 %
Platelet: 182 x10 3/mm (ref 150–440)
RDW: 15.1 % — ABNORMAL HIGH (ref 11.5–14.5)

## 2012-01-02 ENCOUNTER — Ambulatory Visit: Payer: Self-pay | Admitting: Oncology

## 2012-03-11 ENCOUNTER — Ambulatory Visit: Payer: Self-pay | Admitting: Oncology

## 2012-03-13 ENCOUNTER — Ambulatory Visit: Payer: Self-pay | Admitting: Unknown Physician Specialty

## 2012-03-17 ENCOUNTER — Ambulatory Visit: Payer: Self-pay | Admitting: Surgery

## 2012-03-19 LAB — COMPREHENSIVE METABOLIC PANEL
Alkaline Phosphatase: 74 U/L (ref 50–136)
Anion Gap: 12 (ref 7–16)
BUN: 30 mg/dL — ABNORMAL HIGH (ref 7–18)
Bilirubin,Total: 0.2 mg/dL (ref 0.2–1.0)
Calcium, Total: 8.7 mg/dL (ref 8.5–10.1)
Chloride: 105 mmol/L (ref 98–107)
Co2: 22 mmol/L (ref 21–32)
Creatinine: 2.14 mg/dL — ABNORMAL HIGH (ref 0.60–1.30)
Glucose: 102 mg/dL — ABNORMAL HIGH (ref 65–99)
Total Protein: 7.8 g/dL (ref 6.4–8.2)

## 2012-03-19 LAB — CBC CANCER CENTER
Basophil #: 0 x10 3/mm (ref 0.0–0.1)
Basophil %: 0.4 %
Eosinophil #: 0.1 x10 3/mm (ref 0.0–0.7)
Eosinophil %: 1.6 %
HCT: 30.9 % — ABNORMAL LOW (ref 35.0–47.0)
HGB: 9.8 g/dL — ABNORMAL LOW (ref 12.0–16.0)
Lymphocyte #: 0.6 x10 3/mm — ABNORMAL LOW (ref 1.0–3.6)
Lymphocyte %: 13.3 %
Neutrophil %: 78.2 %
RDW: 14 % (ref 11.5–14.5)
WBC: 4.6 x10 3/mm (ref 3.6–11.0)

## 2012-03-31 ENCOUNTER — Ambulatory Visit: Payer: Self-pay | Admitting: Surgery

## 2012-04-01 ENCOUNTER — Ambulatory Visit: Payer: Self-pay | Admitting: Oncology

## 2012-04-22 ENCOUNTER — Ambulatory Visit: Payer: Self-pay | Admitting: Surgery

## 2012-04-22 LAB — BASIC METABOLIC PANEL
Anion Gap: 7 (ref 7–16)
Calcium, Total: 8.9 mg/dL (ref 8.5–10.1)
Chloride: 112 mmol/L — ABNORMAL HIGH (ref 98–107)
Co2: 23 mmol/L (ref 21–32)
Glucose: 79 mg/dL (ref 65–99)
Potassium: 3.4 mmol/L — ABNORMAL LOW (ref 3.5–5.1)

## 2012-04-22 LAB — CBC WITH DIFFERENTIAL/PLATELET
Basophil #: 0 10*3/uL (ref 0.0–0.1)
Eosinophil %: 3.4 %
HCT: 27.5 % — ABNORMAL LOW (ref 35.0–47.0)
HGB: 8.7 g/dL — ABNORMAL LOW (ref 12.0–16.0)
MCH: 25.6 pg — ABNORMAL LOW (ref 26.0–34.0)
MCHC: 31.8 g/dL — ABNORMAL LOW (ref 32.0–36.0)
MCV: 81 fL (ref 80–100)
Monocyte #: 0.2 x10 3/mm (ref 0.2–0.9)
Neutrophil #: 3 10*3/uL (ref 1.4–6.5)
Neutrophil %: 75.4 %
Platelet: 232 10*3/uL (ref 150–440)
RBC: 3.41 10*6/uL — ABNORMAL LOW (ref 3.80–5.20)
RDW: 14.3 % (ref 11.5–14.5)

## 2012-04-22 LAB — APTT: Activated PTT: 33.1 secs (ref 23.6–35.9)

## 2012-04-22 LAB — PROTIME-INR: INR: 1

## 2012-04-30 ENCOUNTER — Inpatient Hospital Stay: Payer: Self-pay | Admitting: Surgery

## 2012-05-01 ENCOUNTER — Ambulatory Visit: Payer: Self-pay | Admitting: Oncology

## 2012-05-01 LAB — CBC WITH DIFFERENTIAL/PLATELET
Basophil #: 0 10*3/uL (ref 0.0–0.1)
Eosinophil #: 0 10*3/uL (ref 0.0–0.7)
Eosinophil %: 1.1 %
HCT: 23.1 % — ABNORMAL LOW (ref 35.0–47.0)
HGB: 7.7 g/dL — ABNORMAL LOW (ref 12.0–16.0)
Lymphocyte %: 10.6 %
MCH: 26.2 pg (ref 26.0–34.0)
MCHC: 33.3 g/dL (ref 32.0–36.0)
Monocyte #: 0.3 x10 3/mm (ref 0.2–0.9)
Monocyte %: 7.9 %
Neutrophil %: 79.9 %
Platelet: 205 10*3/uL (ref 150–440)
RBC: 2.93 10*6/uL — ABNORMAL LOW (ref 3.80–5.20)
RDW: 14.4 % (ref 11.5–14.5)
WBC: 4.2 10*3/uL (ref 3.6–11.0)

## 2012-05-01 LAB — BASIC METABOLIC PANEL
Chloride: 115 mmol/L — ABNORMAL HIGH (ref 98–107)
Co2: 21 mmol/L (ref 21–32)
Glucose: 103 mg/dL — ABNORMAL HIGH (ref 65–99)
Potassium: 3.9 mmol/L (ref 3.5–5.1)
Sodium: 143 mmol/L (ref 136–145)

## 2012-05-02 DIAGNOSIS — I959 Hypotension, unspecified: Secondary | ICD-10-CM

## 2012-05-02 LAB — CBC WITH DIFFERENTIAL/PLATELET
Bands: 24 %
Basophil #: 0 10*3/uL (ref 0.0–0.1)
Basophil %: 0.1 %
Eosinophil #: 0 10*3/uL (ref 0.0–0.7)
Eosinophil %: 0.7 %
HCT: 23.9 % — ABNORMAL LOW (ref 35.0–47.0)
HCT: 20.5 % — ABNORMAL LOW (ref 35.0–47.0)
HGB: 6.5 g/dL — ABNORMAL LOW (ref 12.0–16.0)
MCH: 25.6 pg — ABNORMAL LOW (ref 26.0–34.0)
MCHC: 32.6 g/dL (ref 32.0–36.0)
MCHC: 31.8 g/dL — ABNORMAL LOW (ref 32.0–36.0)
MCV: 79 fL — ABNORMAL LOW (ref 80–100)
MCV: 81 fL (ref 80–100)
Metamyelocyte: 10 %
Monocyte #: 0.1 x10 3/mm — ABNORMAL LOW (ref 0.2–0.9)
Monocyte %: 4.1 %
Myelocyte: 2 %
Neutrophil #: 2.2 10*3/uL (ref 1.4–6.5)
Neutrophil %: 89.2 %
Platelet: 171 10*3/uL (ref 150–440)
Platelet: 152 10*3/uL (ref 150–440)
RBC: 2.55 10*6/uL — ABNORMAL LOW (ref 3.80–5.20)
RDW: 14.7 % — ABNORMAL HIGH (ref 11.5–14.5)
Segmented Neutrophils: 28 %

## 2012-05-02 LAB — BASIC METABOLIC PANEL
Anion Gap: 8 (ref 7–16)
BUN: 15 mg/dL (ref 7–18)
Chloride: 120 mmol/L — ABNORMAL HIGH (ref 98–107)
Co2: 18 mmol/L — ABNORMAL LOW (ref 21–32)
Creatinine: 1.8 mg/dL — ABNORMAL HIGH (ref 0.60–1.30)
Glucose: 128 mg/dL — ABNORMAL HIGH (ref 65–99)
Osmolality: 293 (ref 275–301)
Potassium: 3 mmol/L — ABNORMAL LOW (ref 3.5–5.1)
Sodium: 146 mmol/L — ABNORMAL HIGH (ref 136–145)

## 2012-05-02 LAB — URINALYSIS, COMPLETE
Bilirubin,UR: NEGATIVE
Blood: NEGATIVE
Ketone: NEGATIVE
Nitrite: NEGATIVE
Ph: 5 (ref 4.5–8.0)
RBC,UR: <1 /HPF (ref 0–5)
Specific Gravity: 1.009 (ref 1.003–1.030)
WBC UR: 1 /HPF (ref 0–5)

## 2012-05-03 LAB — COMPREHENSIVE METABOLIC PANEL
Alkaline Phosphatase: 52 U/L (ref 50–136)
Anion Gap: 8 (ref 7–16)
BUN: 23 mg/dL — ABNORMAL HIGH (ref 7–18)
Creatinine: 1.78 mg/dL — ABNORMAL HIGH (ref 0.60–1.30)
EGFR (African American): 37 — ABNORMAL LOW
EGFR (Non-African Amer.): 32 — ABNORMAL LOW
Glucose: 93 mg/dL (ref 65–99)
Osmolality: 294 (ref 275–301)
Potassium: 4.3 mmol/L (ref 3.5–5.1)
SGOT(AST): 33 U/L (ref 15–37)
Sodium: 146 mmol/L — ABNORMAL HIGH (ref 136–145)
Total Protein: 5.5 g/dL — ABNORMAL LOW (ref 6.4–8.2)

## 2012-05-03 LAB — BASIC METABOLIC PANEL
Anion Gap: 8 (ref 7–16)
BUN: 20 mg/dL — ABNORMAL HIGH (ref 7–18)
Calcium, Total: 7.3 mg/dL — ABNORMAL LOW (ref 8.5–10.1)
Creatinine: 1.81 mg/dL — ABNORMAL HIGH (ref 0.60–1.30)
EGFR (Non-African Amer.): 31 — ABNORMAL LOW
Glucose: 117 mg/dL — ABNORMAL HIGH (ref 65–99)
Osmolality: 294 (ref 275–301)

## 2012-05-03 LAB — CBC WITH DIFFERENTIAL/PLATELET
Basophil #: 0 10*3/uL (ref 0.0–0.1)
Basophil %: 0.2 %
Eosinophil %: 0.5 %
HCT: 34.3 % — ABNORMAL LOW (ref 35.0–47.0)
HGB: 10.9 g/dL — ABNORMAL LOW (ref 12.0–16.0)
Lymphocyte #: 0.1 10*3/uL — ABNORMAL LOW (ref 1.0–3.6)
Lymphocyte #: 0.2 10*3/uL — ABNORMAL LOW (ref 1.0–3.6)
Lymphocyte %: 4.8 %
Lymphocyte %: 5.9 %
MCHC: 33.5 g/dL (ref 32.0–36.0)
MCHC: 33.2 g/dL (ref 32.0–36.0)
MCV: 81 fL (ref 80–100)
MCV: 81 fL (ref 80–100)
Monocyte %: 3.9 %
Monocyte %: 2.1 %
Neutrophil #: 2.2 10*3/uL (ref 1.4–6.5)
Platelet: 145 10*3/uL — ABNORMAL LOW (ref 150–440)
Platelet: 152 10*3/uL (ref 150–440)
RBC: 4.04 10*6/uL (ref 3.80–5.20)
RBC: 4.22 10*6/uL (ref 3.80–5.20)
RDW: 15 % — ABNORMAL HIGH (ref 11.5–14.5)
RDW: 15.1 % — ABNORMAL HIGH (ref 11.5–14.5)
WBC: 2.4 10*3/uL — ABNORMAL LOW (ref 3.6–11.0)
WBC: 3 10*3/uL — ABNORMAL LOW (ref 3.6–11.0)

## 2012-05-03 LAB — TROPONIN I: Troponin-I: <0.02 ng/mL

## 2012-05-04 DIAGNOSIS — R0602 Shortness of breath: Secondary | ICD-10-CM

## 2012-05-04 LAB — CBC WITH DIFFERENTIAL/PLATELET
MCH: 26.3 pg (ref 26.0–34.0)
Monocytes: 2 %
Platelet: 141 10*3/uL — ABNORMAL LOW (ref 150–440)
RBC: 3.96 10*6/uL (ref 3.80–5.20)
RDW: 15.3 % — ABNORMAL HIGH (ref 11.5–14.5)
Segmented Neutrophils: 63 %

## 2012-05-04 LAB — TROPONIN I: Troponin-I: 0.04 ng/mL

## 2012-05-04 LAB — TSH: Thyroid Stimulating Horm: 0.123 u[IU]/mL — ABNORMAL LOW

## 2012-05-04 LAB — CREATININE, SERUM
Creatinine: 1.45 mg/dL — ABNORMAL HIGH (ref 0.60–1.30)
EGFR (African American): 47 — ABNORMAL LOW

## 2012-05-05 LAB — COMPREHENSIVE METABOLIC PANEL
Albumin: 2.1 g/dL — ABNORMAL LOW (ref 3.4–5.0)
Alkaline Phosphatase: 58 U/L (ref 50–136)
Anion Gap: 8 (ref 7–16)
Chloride: 119 mmol/L — ABNORMAL HIGH (ref 98–107)
Creatinine: 1.66 mg/dL — ABNORMAL HIGH (ref 0.60–1.30)
EGFR (African American): 40 — ABNORMAL LOW
EGFR (Non-African Amer.): 35 — ABNORMAL LOW
Osmolality: 289 (ref 275–301)
SGOT(AST): 18 U/L (ref 15–37)
SGPT (ALT): 19 U/L (ref 12–78)
Total Protein: 5 g/dL — ABNORMAL LOW (ref 6.4–8.2)

## 2012-05-05 LAB — CBC WITH DIFFERENTIAL/PLATELET
Eosinophil: 5 %
HGB: 9.4 g/dL — ABNORMAL LOW (ref 12.0–16.0)
Lymphocytes: 18 %
MCH: 26.8 pg (ref 26.0–34.0)
NRBC/100 WBC: 1 /
Platelet: 114 10*3/uL — ABNORMAL LOW (ref 150–440)
RBC: 3.5 10*6/uL — ABNORMAL LOW (ref 3.80–5.20)
RDW: 15.6 % — ABNORMAL HIGH (ref 11.5–14.5)
Segmented Neutrophils: 66 %

## 2012-05-05 LAB — TROPONIN I
Troponin-I: 0.04 ng/mL
Troponin-I: <0.02 ng/mL

## 2012-05-05 LAB — CK
CK, Total: 14 U/L — ABNORMAL LOW (ref 21–215)
CK, Total: 15 U/L — ABNORMAL LOW (ref 21–215)

## 2012-05-05 LAB — CK-MB
CK-MB: 0.8 ng/mL (ref 0.5–3.6)
CK-MB: 0.5 ng/mL (ref 0.5–3.6)

## 2012-05-05 LAB — MAGNESIUM: Magnesium: 1.7 mg/dL — ABNORMAL LOW

## 2012-05-06 LAB — IRON AND TIBC
Iron Bind.Cap.(Total): 151 ug/dL — ABNORMAL LOW (ref 250–450)
Iron Saturation: 14 %
Iron: 21 ug/dL — ABNORMAL LOW (ref 50–170)

## 2012-05-06 LAB — CBC WITH DIFFERENTIAL/PLATELET
Basophil %: 0.1 %
Eosinophil %: 1.1 %
HCT: 27.4 % — ABNORMAL LOW (ref 35.0–47.0)
Lymphocyte #: 0.2 10*3/uL — ABNORMAL LOW (ref 1.0–3.6)
Lymphocyte %: 3.7 %
MCHC: 33.9 g/dL (ref 32.0–36.0)
Monocyte #: 0.1 x10 3/mm — ABNORMAL LOW (ref 0.2–0.9)
Neutrophil #: 4.8 10*3/uL (ref 1.4–6.5)
Neutrophil %: 92.4 %

## 2012-05-06 LAB — BASIC METABOLIC PANEL
Chloride: 114 mmol/L — ABNORMAL HIGH (ref 98–107)
Creatinine: 1.65 mg/dL — ABNORMAL HIGH (ref 0.60–1.30)
EGFR (African American): 40 — ABNORMAL LOW
EGFR (Non-African Amer.): 35 — ABNORMAL LOW
Potassium: 3.1 mmol/L — ABNORMAL LOW (ref 3.5–5.1)
Sodium: 139 mmol/L (ref 136–145)

## 2012-05-07 LAB — CBC WITH DIFFERENTIAL/PLATELET
Bands: 6 %
HCT: 28.9 % — ABNORMAL LOW (ref 35.0–47.0)
Lymphocytes: 3 %
MCHC: 33.8 g/dL (ref 32.0–36.0)
NRBC/100 WBC: 1 /
Platelet: 120 10*3/uL — ABNORMAL LOW (ref 150–440)
RDW: 15.6 % — ABNORMAL HIGH (ref 11.5–14.5)
Segmented Neutrophils: 85 %
Variant Lymphocyte - H1-Rlymph: 1 %
WBC: 6.5 10*3/uL (ref 3.6–11.0)

## 2012-05-07 LAB — BASIC METABOLIC PANEL
Calcium, Total: 8 mg/dL — ABNORMAL LOW (ref 8.5–10.1)
Chloride: 112 mmol/L — ABNORMAL HIGH (ref 98–107)
Co2: 16 mmol/L — ABNORMAL LOW (ref 21–32)
Creatinine: 1.69 mg/dL — ABNORMAL HIGH (ref 0.60–1.30)
EGFR (Non-African Amer.): 34 — ABNORMAL LOW
Osmolality: 278 (ref 275–301)
Potassium: 2.9 mmol/L — ABNORMAL LOW (ref 3.5–5.1)
Sodium: 139 mmol/L (ref 136–145)

## 2012-05-08 LAB — URINALYSIS, COMPLETE
Bacteria: NONE SEEN
Bilirubin,UR: NEGATIVE
Glucose,UR: NEGATIVE mg/dL (ref 0–75)
Leukocyte Esterase: NEGATIVE
Nitrite: NEGATIVE
Protein: 30
Squamous Epithelial: NONE SEEN

## 2012-05-08 LAB — CBC WITH DIFFERENTIAL/PLATELET
Basophil #: 0 10*3/uL (ref 0.0–0.1)
Eosinophil #: 0 10*3/uL (ref 0.0–0.7)
HCT: 26.7 % — ABNORMAL LOW (ref 35.0–47.0)
HGB: 9.1 g/dL — ABNORMAL LOW (ref 12.0–16.0)
MCHC: 34.2 g/dL (ref 32.0–36.0)
MCV: 78 fL — ABNORMAL LOW (ref 80–100)
Neutrophil #: 6.5 10*3/uL (ref 1.4–6.5)
Platelet: 133 10*3/uL — ABNORMAL LOW (ref 150–440)
RBC: 3.45 10*6/uL — ABNORMAL LOW (ref 3.80–5.20)
RDW: 15.8 % — ABNORMAL HIGH (ref 11.5–14.5)
WBC: 6.8 10*3/uL (ref 3.6–11.0)

## 2012-05-08 LAB — BASIC METABOLIC PANEL
Anion Gap: 12 (ref 7–16)
Chloride: 109 mmol/L — ABNORMAL HIGH (ref 98–107)
Glucose: 100 mg/dL — ABNORMAL HIGH (ref 65–99)
Potassium: 2.6 mmol/L — ABNORMAL LOW (ref 3.5–5.1)

## 2012-05-08 LAB — CLOSTRIDIUM DIFFICILE BY PCR

## 2012-05-09 LAB — BASIC METABOLIC PANEL
Chloride: 112 mmol/L — ABNORMAL HIGH (ref 98–107)
Co2: 18 mmol/L — ABNORMAL LOW (ref 21–32)
Creatinine: 1.63 mg/dL — ABNORMAL HIGH (ref 0.60–1.30)
EGFR (African American): 41 — ABNORMAL LOW
EGFR (Non-African Amer.): 35 — ABNORMAL LOW
Osmolality: 279 (ref 275–301)
Potassium: 3.6 mmol/L (ref 3.5–5.1)

## 2012-05-09 LAB — CBC WITH DIFFERENTIAL/PLATELET
Basophil #: 0 10*3/uL (ref 0.0–0.1)
Eosinophil %: 1.6 %
HGB: 8.9 g/dL — ABNORMAL LOW (ref 12.0–16.0)
Lymphocyte #: 0.1 10*3/uL — ABNORMAL LOW (ref 1.0–3.6)
MCH: 26.7 pg (ref 26.0–34.0)
MCHC: 34.2 g/dL (ref 32.0–36.0)
MCV: 78 fL — ABNORMAL LOW (ref 80–100)
Monocyte %: 3 %
Neutrophil #: 5 10*3/uL (ref 1.4–6.5)
RDW: 15.7 % — ABNORMAL HIGH (ref 11.5–14.5)
WBC: 5.4 10*3/uL (ref 3.6–11.0)

## 2012-05-10 LAB — BASIC METABOLIC PANEL
BUN: 19 mg/dL — ABNORMAL HIGH (ref 7–18)
Chloride: 110 mmol/L — ABNORMAL HIGH (ref 98–107)
Creatinine: 1.57 mg/dL — ABNORMAL HIGH (ref 0.60–1.30)
EGFR (African American): 43 — ABNORMAL LOW
EGFR (Non-African Amer.): 37 — ABNORMAL LOW
Glucose: 81 mg/dL (ref 65–99)
Osmolality: 273 (ref 275–301)
Potassium: 4.7 mmol/L (ref 3.5–5.1)
Sodium: 136 mmol/L (ref 136–145)

## 2012-05-10 LAB — CBC WITH DIFFERENTIAL/PLATELET
Bands: 7 %
HCT: 29.7 % — ABNORMAL LOW (ref 35.0–47.0)
HGB: 10 g/dL — ABNORMAL LOW (ref 12.0–16.0)
Lymphocytes: 5 %
MCH: 26.3 pg (ref 26.0–34.0)
MCHC: 33.7 g/dL (ref 32.0–36.0)
MCV: 78 fL — ABNORMAL LOW (ref 80–100)
Monocytes: 1 %
NRBC/100 WBC: 1 /
Platelet: 194 10*3/uL (ref 150–440)
RBC: 3.8 10*6/uL (ref 3.80–5.20)
RDW: 16.2 % — ABNORMAL HIGH (ref 11.5–14.5)

## 2012-05-10 LAB — URINE CULTURE

## 2012-05-11 LAB — CBC WITH DIFFERENTIAL/PLATELET
Basophil #: 0 10*3/uL (ref 0.0–0.1)
Basophil #: 0 10*3/uL (ref 0.0–0.1)
Basophil %: 0 %
Basophil %: 0.2 %
Eosinophil #: 0.1 10*3/uL (ref 0.0–0.7)
Eosinophil %: 0.9 %
HGB: 9.9 g/dL — ABNORMAL LOW (ref 12.0–16.0)
Lymphocyte #: 0.3 10*3/uL — ABNORMAL LOW (ref 1.0–3.6)
Lymphocyte #: 0.3 10*3/uL — ABNORMAL LOW (ref 1.0–3.6)
Lymphocyte %: 7.6 %
MCH: 26.3 pg (ref 26.0–34.0)
MCH: 26.8 pg (ref 26.0–34.0)
MCHC: 33.7 g/dL (ref 32.0–36.0)
MCHC: 33.8 g/dL (ref 32.0–36.0)
Monocyte #: 0.2 x10 3/mm (ref 0.2–0.9)
Monocyte #: 0 x10 3/mm — ABNORMAL LOW (ref 0.2–0.9)
Monocyte %: 2.9 %
Neutrophil #: 3.4 10*3/uL (ref 1.4–6.5)
Neutrophil %: 91.8 %
Neutrophil %: 90.9 %
Platelet: 279 10*3/uL (ref 150–440)
RBC: 3.68 10*6/uL — ABNORMAL LOW (ref 3.80–5.20)
RDW: 15.9 % — ABNORMAL HIGH (ref 11.5–14.5)
WBC: 7 10*3/uL (ref 3.6–11.0)
WBC: 3.8 10*3/uL (ref 3.6–11.0)

## 2012-05-11 LAB — BASIC METABOLIC PANEL
Anion Gap: 7 (ref 7–16)
BUN: 17 mg/dL (ref 7–18)
Calcium, Total: 7.5 mg/dL — ABNORMAL LOW (ref 8.5–10.1)
Chloride: 111 mmol/L — ABNORMAL HIGH (ref 98–107)
Co2: 19 mmol/L — ABNORMAL LOW (ref 21–32)
Co2: 20 mmol/L — ABNORMAL LOW (ref 21–32)
Creatinine: 1.61 mg/dL — ABNORMAL HIGH (ref 0.60–1.30)
Creatinine: 1.45 mg/dL — ABNORMAL HIGH (ref 0.60–1.30)
EGFR (African American): 42 — ABNORMAL LOW
EGFR (African American): 47 — ABNORMAL LOW
EGFR (Non-African Amer.): 36 — ABNORMAL LOW
EGFR (Non-African Amer.): 41 — ABNORMAL LOW
Glucose: 102 mg/dL — ABNORMAL HIGH (ref 65–99)
Osmolality: 275 (ref 275–301)
Potassium: 3.9 mmol/L (ref 3.5–5.1)
Sodium: 137 mmol/L (ref 136–145)

## 2012-05-12 LAB — BASIC METABOLIC PANEL
Anion Gap: 12 (ref 7–16)
BUN: 17 mg/dL (ref 7–18)
Calcium, Total: 7.2 mg/dL — ABNORMAL LOW (ref 8.5–10.1)
Chloride: 111 mmol/L — ABNORMAL HIGH (ref 98–107)
Creatinine: 1.49 mg/dL — ABNORMAL HIGH (ref 0.60–1.30)
EGFR (Non-African Amer.): 39 — ABNORMAL LOW
Glucose: 105 mg/dL — ABNORMAL HIGH (ref 65–99)
Potassium: 4 mmol/L (ref 3.5–5.1)

## 2012-05-12 LAB — CBC WITH DIFFERENTIAL/PLATELET
HCT: 27.8 % — ABNORMAL LOW (ref 35.0–47.0)
Lymphocytes: 3 %
MCH: 26.3 pg (ref 26.0–34.0)
MCHC: 33.6 g/dL (ref 32.0–36.0)
MCV: 79 fL — ABNORMAL LOW (ref 80–100)
Monocytes: 4 %
Platelet: 292 10*3/uL (ref 150–440)
RBC: 3.55 10*6/uL — ABNORMAL LOW (ref 3.80–5.20)
RDW: 16 % — ABNORMAL HIGH (ref 11.5–14.5)
WBC: 12.9 10*3/uL — ABNORMAL HIGH (ref 3.6–11.0)

## 2012-05-13 LAB — COMPREHENSIVE METABOLIC PANEL
Anion Gap: 9 (ref 7–16)
BUN: 17 mg/dL (ref 7–18)
Bilirubin,Total: 0.4 mg/dL (ref 0.2–1.0)
Calcium, Total: 6.8 mg/dL — CL (ref 8.5–10.1)
Chloride: 108 mmol/L — ABNORMAL HIGH (ref 98–107)
Co2: 19 mmol/L — ABNORMAL LOW (ref 21–32)
Creatinine: 1.58 mg/dL — ABNORMAL HIGH (ref 0.60–1.30)
Glucose: 191 mg/dL — ABNORMAL HIGH (ref 65–99)
Osmolality: 279 (ref 275–301)
SGOT(AST): 9 U/L — ABNORMAL LOW (ref 15–37)
Total Protein: 4 g/dL — ABNORMAL LOW (ref 6.4–8.2)

## 2012-05-13 LAB — CBC WITH DIFFERENTIAL/PLATELET
Eosinophil %: 0 %
HGB: 7 g/dL — ABNORMAL LOW (ref 12.0–16.0)
Lymphocyte #: 0.2 10*3/uL — ABNORMAL LOW (ref 1.0–3.6)
Lymphocyte %: 1.1 %
Neutrophil %: 96.4 %
RDW: 16.2 % — ABNORMAL HIGH (ref 11.5–14.5)

## 2012-05-13 LAB — PHOSPHORUS: Phosphorus: 2.4 mg/dL — ABNORMAL LOW (ref 2.5–4.9)

## 2012-05-13 LAB — MAGNESIUM: Magnesium: 1.2 mg/dL — ABNORMAL LOW

## 2012-05-14 LAB — PHOSPHORUS: Phosphorus: 1.9 mg/dL — ABNORMAL LOW (ref 2.5–4.9)

## 2012-05-14 LAB — BASIC METABOLIC PANEL
Anion Gap: 7 (ref 7–16)
BUN: 11 mg/dL (ref 7–18)
EGFR (African American): 50 — ABNORMAL LOW
Osmolality: 273 (ref 275–301)
Potassium: 4.3 mmol/L (ref 3.5–5.1)
Sodium: 135 mmol/L — ABNORMAL LOW (ref 136–145)

## 2012-05-14 LAB — CBC WITH DIFFERENTIAL/PLATELET
Basophil %: 0.3 %
Eosinophil #: 0 10*3/uL (ref 0.0–0.7)
Eosinophil %: 0 %
HCT: 29.7 % — ABNORMAL LOW (ref 35.0–47.0)
HGB: 10.1 g/dL — ABNORMAL LOW (ref 12.0–16.0)
Lymphocyte #: 0.2 10*3/uL — ABNORMAL LOW (ref 1.0–3.6)
Lymphocyte %: 1.1 %
MCV: 77 fL — ABNORMAL LOW (ref 80–100)
Monocyte #: 0.3 x10 3/mm (ref 0.2–0.9)
Monocyte %: 1.9 %
Neutrophil #: 14.1 10*3/uL — ABNORMAL HIGH (ref 1.4–6.5)
Platelet: 299 10*3/uL (ref 150–440)
RBC: 3.85 10*6/uL (ref 3.80–5.20)
WBC: 14.6 10*3/uL — ABNORMAL HIGH (ref 3.6–11.0)

## 2012-05-15 LAB — BASIC METABOLIC PANEL
Anion Gap: 6 — ABNORMAL LOW (ref 7–16)
BUN: 7 mg/dL (ref 7–18)
Calcium, Total: 7.1 mg/dL — ABNORMAL LOW (ref 8.5–10.1)
Creatinine: 1.16 mg/dL (ref 0.60–1.30)
EGFR (African American): >60
Glucose: 118 mg/dL — ABNORMAL HIGH (ref 65–99)
Osmolality: 269 (ref 275–301)
Potassium: 4.3 mmol/L (ref 3.5–5.1)

## 2012-05-15 LAB — CBC WITH DIFFERENTIAL/PLATELET
Basophil #: 0.1 10*3/uL (ref 0.0–0.1)
Eosinophil %: 0.4 %
Lymphocyte #: 0.3 10*3/uL — ABNORMAL LOW (ref 1.0–3.6)
Lymphocyte %: 2.4 %
MCHC: 33.6 g/dL (ref 32.0–36.0)
MCV: 78 fL — ABNORMAL LOW (ref 80–100)
Monocyte #: 0.4 x10 3/mm (ref 0.2–0.9)
Monocyte %: 3.1 %
Neutrophil %: 92.9 %
Platelet: 361 10*3/uL (ref 150–440)
RBC: 4.16 10*6/uL (ref 3.80–5.20)
RDW: 16.9 % — ABNORMAL HIGH (ref 11.5–14.5)

## 2012-05-15 LAB — MAGNESIUM: Magnesium: 1.1 mg/dL — ABNORMAL LOW

## 2012-05-15 LAB — PHOSPHORUS: Phosphorus: 1.8 mg/dL — ABNORMAL LOW (ref 2.5–4.9)

## 2012-05-16 LAB — TPN PANEL
Albumin: 1.2 g/dL — ABNORMAL LOW (ref 3.4–5.0)
Alkaline Phosphatase: 122 U/L (ref 50–136)
Anion Gap: 5 — ABNORMAL LOW (ref 7–16)
BUN: 5 mg/dL — ABNORMAL LOW (ref 7–18)
Chloride: 100 mmol/L (ref 98–107)
Cholesterol: 91 mg/dL (ref 0–200)
Co2: 27 mmol/L (ref 21–32)
EGFR (Non-African Amer.): 50 — ABNORMAL LOW
HGB: 10.4 g/dL — ABNORMAL LOW (ref 12.0–16.0)
INR: 1.2
Osmolality: 265 (ref 275–301)
Phosphorus: 2.8 mg/dL (ref 2.5–4.9)
Prothrombin Time: 15.6 secs — ABNORMAL HIGH (ref 11.5–14.7)
Sodium: 132 mmol/L — ABNORMAL LOW (ref 136–145)
Total Protein: 4 g/dL — ABNORMAL LOW (ref 6.4–8.2)
Triglycerides: 112 mg/dL (ref 0–200)
WBC: 12.7 10*3/uL — ABNORMAL HIGH (ref 3.6–11.0)

## 2012-05-16 LAB — CBC WITH DIFFERENTIAL/PLATELET
Basophil #: 0.1 10*3/uL (ref 0.0–0.1)
Eosinophil #: 0.1 10*3/uL (ref 0.0–0.7)
Eosinophil %: 0.6 %
HCT: 26.9 % — ABNORMAL LOW (ref 35.0–47.0)
HGB: 9 g/dL — ABNORMAL LOW (ref 12.0–16.0)
MCHC: 33.6 g/dL (ref 32.0–36.0)
MCV: 79 fL — ABNORMAL LOW (ref 80–100)
Neutrophil %: 92 %
Platelet: 362 10*3/uL (ref 150–440)
RDW: 16.6 % — ABNORMAL HIGH (ref 11.5–14.5)
WBC: 11.4 10*3/uL — ABNORMAL HIGH (ref 3.6–11.0)

## 2012-05-16 LAB — URINALYSIS, COMPLETE
Glucose,UR: NEGATIVE mg/dL (ref 0–75)
Ketone: NEGATIVE
RBC,UR: 2 /HPF (ref 0–5)
Specific Gravity: 1.006 (ref 1.003–1.030)
Squamous Epithelial: <1

## 2012-05-17 LAB — TPN PANEL
Activated PTT: 45.1 secs — ABNORMAL HIGH (ref 23.6–35.9)
Albumin: 1.2 g/dL — ABNORMAL LOW (ref 3.4–5.0)
Alkaline Phosphatase: 93 U/L (ref 50–136)
Anion Gap: 6 — ABNORMAL LOW (ref 7–16)
BUN: 9 mg/dL (ref 7–18)
Calcium, Total: 6.5 mg/dL — CL (ref 8.5–10.1)
Chloride: 100 mmol/L (ref 98–107)
Cholesterol: 75 mg/dL (ref 0–200)
Co2: 28 mmol/L (ref 21–32)
Creatinine: 1.1 mg/dL (ref 0.60–1.30)
EGFR (African American): >60
HGB: 7.8 g/dL — ABNORMAL LOW (ref 12.0–16.0)
INR: 1.2
Magnesium: 1.9 mg/dL
Osmolality: 272 (ref 275–301)
Phosphorus: 2.4 mg/dL — ABNORMAL LOW (ref 2.5–4.9)
SGOT(AST): 10 U/L — ABNORMAL LOW (ref 15–37)
Total Protein: 3.9 g/dL — ABNORMAL LOW (ref 6.4–8.2)
Triglycerides: 103 mg/dL (ref 0–200)

## 2012-05-17 LAB — HEMATOCRIT: HCT: 28.7 % — ABNORMAL LOW (ref 35.0–47.0)

## 2012-05-18 LAB — TPN PANEL
Activated PTT: 39.2 secs — ABNORMAL HIGH (ref 23.6–35.9)
Albumin: 1.3 g/dL — ABNORMAL LOW (ref 3.4–5.0)
Anion Gap: 6 — ABNORMAL LOW (ref 7–16)
BUN: 13 mg/dL (ref 7–18)
Calcium, Total: 6.6 mg/dL — CL (ref 8.5–10.1)
Cholesterol: 74 mg/dL (ref 0–200)
Co2: 28 mmol/L (ref 21–32)
Creatinine: 1.04 mg/dL (ref 0.60–1.30)
EGFR (African American): >60
Glucose: 192 mg/dL — ABNORMAL HIGH (ref 65–99)
INR: 1.1
Magnesium: 1.3 mg/dL — ABNORMAL LOW
Phosphorus: 2 mg/dL — ABNORMAL LOW (ref 2.5–4.9)
Prothrombin Time: 14.3 secs (ref 11.5–14.7)
SGOT(AST): 11 U/L — ABNORMAL LOW (ref 15–37)
Sodium: 134 mmol/L — ABNORMAL LOW (ref 136–145)
Total Protein: 4.2 g/dL — ABNORMAL LOW (ref 6.4–8.2)
Triglycerides: 106 mg/dL (ref 0–200)

## 2012-05-18 LAB — URINALYSIS, COMPLETE
Bilirubin,UR: NEGATIVE
Glucose,UR: 50 mg/dL (ref 0–75)
Ketone: NEGATIVE
Leukocyte Esterase: NEGATIVE
Nitrite: NEGATIVE
Ph: 7 (ref 4.5–8.0)
RBC,UR: 3 /HPF (ref 0–5)
WBC UR: NONE SEEN /HPF (ref 0–5)

## 2012-05-19 LAB — TPN PANEL
Activated PTT: 39.4 secs — ABNORMAL HIGH (ref 23.6–35.9)
Albumin: 1.3 g/dL — ABNORMAL LOW (ref 3.4–5.0)
Alkaline Phosphatase: 70 U/L (ref 50–136)
Anion Gap: 4 — ABNORMAL LOW (ref 7–16)
BUN: 15 mg/dL (ref 7–18)
Calcium, Total: 6.7 mg/dL — CL (ref 8.5–10.1)
Chloride: 98 mmol/L (ref 98–107)
Cholesterol: 73 mg/dL (ref 0–200)
Creatinine: 1.07 mg/dL (ref 0.60–1.30)
Glucose: 312 mg/dL — ABNORMAL HIGH (ref 65–99)
HGB: 8.7 g/dL — ABNORMAL LOW (ref 12.0–16.0)
INR: 1.1
Magnesium: 1.6 mg/dL — ABNORMAL LOW
Osmolality: 277 (ref 275–301)
Phosphorus: 1.9 mg/dL — ABNORMAL LOW (ref 2.5–4.9)
Platelet: 382 10*3/uL (ref 150–440)
Potassium: 3.5 mmol/L (ref 3.5–5.1)
SGOT(AST): 12 U/L — ABNORMAL LOW (ref 15–37)
Sodium: 132 mmol/L — ABNORMAL LOW (ref 136–145)
Total Protein: 4.3 g/dL — ABNORMAL LOW (ref 6.4–8.2)
Triglycerides: 95 mg/dL (ref 0–200)
WBC: 10.9 10*3/uL (ref 3.6–11.0)

## 2012-05-19 LAB — CBC WITH DIFFERENTIAL/PLATELET
Basophil #: 0 10*3/uL (ref 0.0–0.1)
Basophil %: 0.1 %
Eosinophil %: 1.3 %
Lymphocyte #: 0.4 10*3/uL — ABNORMAL LOW (ref 1.0–3.6)
MCH: 27.9 pg (ref 26.0–34.0)
Monocyte #: 0.7 x10 3/mm (ref 0.2–0.9)
Monocyte %: 5.1 %
Platelet: 486 10*3/uL — ABNORMAL HIGH (ref 150–440)
RBC: 3.38 10*6/uL — ABNORMAL LOW (ref 3.80–5.20)
WBC: 14.5 10*3/uL — ABNORMAL HIGH (ref 3.6–11.0)

## 2012-05-19 LAB — BASIC METABOLIC PANEL
Anion Gap: 7 (ref 7–16)
BUN: 14 mg/dL (ref 7–18)
Calcium, Total: 6.9 mg/dL — CL (ref 8.5–10.1)
Creatinine: 1.04 mg/dL (ref 0.60–1.30)
EGFR (African American): >60
EGFR (Non-African Amer.): >60
Glucose: 254 mg/dL — ABNORMAL HIGH (ref 65–99)
Osmolality: 272 (ref 275–301)
Potassium: 3.1 mmol/L — ABNORMAL LOW (ref 3.5–5.1)
Sodium: 131 mmol/L — ABNORMAL LOW (ref 136–145)

## 2012-05-19 LAB — PROTIME-INR
INR: 1.1
Prothrombin Time: 14.8 secs — ABNORMAL HIGH (ref 11.5–14.7)

## 2012-05-19 LAB — PHOSPHORUS: Phosphorus: 1.6 mg/dL — ABNORMAL LOW (ref 2.5–4.9)

## 2012-05-20 LAB — CBC WITH DIFFERENTIAL/PLATELET
Basophil #: 0 10*3/uL (ref 0.0–0.1)
Basophil %: 0.2 %
Eosinophil %: 1 %
Lymphocyte #: 0.3 10*3/uL — ABNORMAL LOW (ref 1.0–3.6)
Lymphocyte %: 2 %
Monocyte #: 0.8 x10 3/mm (ref 0.2–0.9)
Neutrophil %: 91.9 %
Platelet: 476 10*3/uL — ABNORMAL HIGH (ref 150–440)
RDW: 16.6 % — ABNORMAL HIGH (ref 11.5–14.5)
WBC: 15.6 10*3/uL — ABNORMAL HIGH (ref 3.6–11.0)

## 2012-05-20 LAB — POTASSIUM: Potassium: 3.3 mmol/L — ABNORMAL LOW (ref 3.5–5.1)

## 2012-05-20 LAB — SODIUM: Sodium: 134 mmol/L — ABNORMAL LOW (ref 136–145)

## 2012-05-20 LAB — MAGNESIUM: Magnesium: 2.1 mg/dL

## 2012-05-21 LAB — CBC WITH DIFFERENTIAL/PLATELET
Basophil #: 0 10*3/uL (ref 0.0–0.1)
Basophil %: 0.2 %
Eosinophil %: 0.9 %
HGB: 8.9 g/dL — ABNORMAL LOW (ref 12.0–16.0)
Lymphocyte #: 0.3 10*3/uL — ABNORMAL LOW (ref 1.0–3.6)
MCHC: 33.6 g/dL (ref 32.0–36.0)
MCV: 81 fL (ref 80–100)
Monocyte %: 4.6 %
Neutrophil %: 92.6 %
Platelet: 607 10*3/uL — ABNORMAL HIGH (ref 150–440)
RBC: 3.27 10*6/uL — ABNORMAL LOW (ref 3.80–5.20)
RDW: 17.1 % — ABNORMAL HIGH (ref 11.5–14.5)
WBC: 17.5 10*3/uL — ABNORMAL HIGH (ref 3.6–11.0)

## 2012-05-21 LAB — BASIC METABOLIC PANEL
Anion Gap: 7 (ref 7–16)
BUN: 15 mg/dL (ref 7–18)
Calcium, Total: 7.5 mg/dL — ABNORMAL LOW (ref 8.5–10.1)
Co2: 27 mmol/L (ref 21–32)
EGFR (African American): >60
EGFR (Non-African Amer.): 57 — ABNORMAL LOW
Glucose: 181 mg/dL — ABNORMAL HIGH (ref 65–99)
Potassium: 4 mmol/L (ref 3.5–5.1)

## 2012-05-21 LAB — PHOSPHORUS: Phosphorus: 2.8 mg/dL (ref 2.5–4.9)

## 2012-05-22 LAB — BASIC METABOLIC PANEL
Anion Gap: 7 (ref 7–16)
Chloride: 98 mmol/L (ref 98–107)
Creatinine: 1.36 mg/dL — ABNORMAL HIGH (ref 0.60–1.30)
EGFR (African American): 51 — ABNORMAL LOW
Glucose: 184 mg/dL — ABNORMAL HIGH (ref 65–99)
Osmolality: 272 (ref 275–301)
Potassium: 4.7 mmol/L (ref 3.5–5.1)
Sodium: 132 mmol/L — ABNORMAL LOW (ref 136–145)

## 2012-05-22 LAB — CBC WITH DIFFERENTIAL/PLATELET
Basophil #: 0 10*3/uL (ref 0.0–0.1)
Basophil %: 0.2 %
Eosinophil #: 0 10*3/uL (ref 0.0–0.7)
HGB: 8.5 g/dL — ABNORMAL LOW (ref 12.0–16.0)
Lymphocyte #: 0.2 10*3/uL — ABNORMAL LOW (ref 1.0–3.6)
Lymphocyte %: 1 %
MCHC: 34.2 g/dL (ref 32.0–36.0)
MCV: 80 fL (ref 80–100)
Monocyte #: 0.4 x10 3/mm (ref 0.2–0.9)
Monocyte %: 1.8 %
Neutrophil %: 97 %
RDW: 16.8 % — ABNORMAL HIGH (ref 11.5–14.5)
WBC: 19.5 10*3/uL — ABNORMAL HIGH (ref 3.6–11.0)

## 2012-05-22 LAB — MAGNESIUM: Magnesium: 2.1 mg/dL

## 2012-05-23 LAB — CBC WITH DIFFERENTIAL/PLATELET
Basophil #: 0.1 10*3/uL (ref 0.0–0.1)
Eosinophil #: 0 10*3/uL (ref 0.0–0.7)
Eosinophil %: 0.1 %
HGB: 8.4 g/dL — ABNORMAL LOW (ref 12.0–16.0)
Lymphocyte #: 0.7 10*3/uL — ABNORMAL LOW (ref 1.0–3.6)
Lymphocyte %: 3.9 %
MCH: 25.9 pg — ABNORMAL LOW (ref 26.0–34.0)
Monocyte #: 0.8 x10 3/mm (ref 0.2–0.9)
Monocyte %: 4.6 %
Neutrophil %: 91.1 %
Platelet: 843 10*3/uL — ABNORMAL HIGH (ref 150–440)
RBC: 3.25 10*6/uL — ABNORMAL LOW (ref 3.80–5.20)
WBC: 17.5 10*3/uL — ABNORMAL HIGH (ref 3.6–11.0)

## 2012-05-23 LAB — BASIC METABOLIC PANEL
Anion Gap: 6 — ABNORMAL LOW (ref 7–16)
BUN: 25 mg/dL — ABNORMAL HIGH (ref 7–18)
Creatinine: 1.62 mg/dL — ABNORMAL HIGH (ref 0.60–1.30)
EGFR (African American): 41 — ABNORMAL LOW
Glucose: 80 mg/dL (ref 65–99)
Potassium: 4.1 mmol/L (ref 3.5–5.1)
Sodium: 137 mmol/L (ref 136–145)

## 2012-05-24 LAB — URINALYSIS, COMPLETE
Bacteria: NONE SEEN
Blood: NEGATIVE
Leukocyte Esterase: NEGATIVE
Nitrite: NEGATIVE
Ph: 6 (ref 4.5–8.0)
Protein: NEGATIVE
RBC,UR: NONE SEEN /HPF (ref 0–5)
Specific Gravity: 1.003 (ref 1.003–1.030)
WBC UR: <1 /HPF (ref 0–5)

## 2012-05-24 LAB — BASIC METABOLIC PANEL
Anion Gap: 7 (ref 7–16)
BUN: 20 mg/dL — ABNORMAL HIGH (ref 7–18)
Calcium, Total: 7.3 mg/dL — ABNORMAL LOW (ref 8.5–10.1)
Chloride: 98 mmol/L (ref 98–107)
Co2: 27 mmol/L (ref 21–32)
Creatinine: 1.37 mg/dL — ABNORMAL HIGH (ref 0.60–1.30)
EGFR (African American): 51 — ABNORMAL LOW
EGFR (Non-African Amer.): 44 — ABNORMAL LOW
Glucose: 225 mg/dL — ABNORMAL HIGH (ref 65–99)
Osmolality: 274 (ref 275–301)
Sodium: 132 mmol/L — ABNORMAL LOW (ref 136–145)

## 2012-05-24 LAB — MAGNESIUM: Magnesium: 1.3 mg/dL — ABNORMAL LOW

## 2012-05-24 LAB — CBC WITH DIFFERENTIAL/PLATELET
Basophil #: 0 10*3/uL (ref 0.0–0.1)
Basophil %: 0.2 %
Basophil %: 0.2 %
Eosinophil #: 0 10*3/uL (ref 0.0–0.7)
Eosinophil %: 0.1 %
Eosinophil %: 0.3 %
HGB: 8.7 g/dL — ABNORMAL LOW (ref 12.0–16.0)
Lymphocyte #: 0.4 10*3/uL — ABNORMAL LOW (ref 1.0–3.6)
Lymphocyte #: 0.4 10*3/uL — ABNORMAL LOW (ref 1.0–3.6)
Lymphocyte %: 2.1 %
MCH: 27.2 pg (ref 26.0–34.0)
MCHC: 33 g/dL (ref 32.0–36.0)
MCHC: 33.3 g/dL (ref 32.0–36.0)
MCV: 81 fL (ref 80–100)
Monocyte #: 1.1 x10 3/mm — ABNORMAL HIGH (ref 0.2–0.9)
Monocyte #: 0.9 x10 3/mm (ref 0.2–0.9)
Monocyte %: 5.4 %
Monocyte %: 5 %
Neutrophil #: 18.6 10*3/uL — ABNORMAL HIGH (ref 1.4–6.5)
Neutrophil #: 16.1 10*3/uL — ABNORMAL HIGH (ref 1.4–6.5)
Neutrophil %: 92.2 %
Platelet: 797 10*3/uL — ABNORMAL HIGH (ref 150–440)
RBC: 3.25 10*6/uL — ABNORMAL LOW (ref 3.80–5.20)
RDW: 17.1 % — ABNORMAL HIGH (ref 11.5–14.5)
RDW: 16.9 % — ABNORMAL HIGH (ref 11.5–14.5)
WBC: 20.2 10*3/uL — ABNORMAL HIGH (ref 3.6–11.0)

## 2012-05-25 LAB — CBC WITH DIFFERENTIAL/PLATELET
Basophil #: 0 10*3/uL (ref 0.0–0.1)
Eosinophil #: 0.1 10*3/uL (ref 0.0–0.7)
Eosinophil %: 0.5 %
HGB: 8.3 g/dL — ABNORMAL LOW (ref 12.0–16.0)
Lymphocyte #: 0.4 10*3/uL — ABNORMAL LOW (ref 1.0–3.6)
Lymphocyte %: 2.3 %
Monocyte #: 0.8 x10 3/mm (ref 0.2–0.9)
Monocyte %: 4.8 %
Neutrophil #: 15.3 10*3/uL — ABNORMAL HIGH (ref 1.4–6.5)
Neutrophil %: 92.3 %
RBC: 3.04 10*6/uL — ABNORMAL LOW (ref 3.80–5.20)
RDW: 17 % — ABNORMAL HIGH (ref 11.5–14.5)

## 2012-05-25 LAB — BASIC METABOLIC PANEL
Anion Gap: 6 — ABNORMAL LOW (ref 7–16)
Chloride: 99 mmol/L (ref 98–107)
Co2: 27 mmol/L (ref 21–32)
Creatinine: 1.16 mg/dL (ref 0.60–1.30)
EGFR (African American): >60
Glucose: 202 mg/dL — ABNORMAL HIGH (ref 65–99)
Osmolality: 269 (ref 275–301)

## 2012-05-26 LAB — CBC WITH DIFFERENTIAL/PLATELET
Basophil #: 0 10*3/uL (ref 0.0–0.1)
Basophil %: 0.3 %
Eosinophil #: 0.1 10*3/uL (ref 0.0–0.7)
HCT: 21.7 % — ABNORMAL LOW (ref 35.0–47.0)
HGB: 7.2 g/dL — ABNORMAL LOW (ref 12.0–16.0)
Lymphocyte %: 2.1 %
MCHC: 32.9 g/dL (ref 32.0–36.0)
MCV: 81 fL (ref 80–100)
Monocyte #: 0.5 x10 3/mm (ref 0.2–0.9)
Monocyte %: 3.8 %
Neutrophil #: 12.8 10*3/uL — ABNORMAL HIGH (ref 1.4–6.5)
Platelet: 677 10*3/uL — ABNORMAL HIGH (ref 150–440)
RBC: 2.67 10*6/uL — ABNORMAL LOW (ref 3.80–5.20)
RDW: 17.1 % — ABNORMAL HIGH (ref 11.5–14.5)

## 2012-05-26 LAB — COMPREHENSIVE METABOLIC PANEL
Albumin: 1.6 g/dL — ABNORMAL LOW (ref 3.4–5.0)
Alkaline Phosphatase: 59 U/L (ref 50–136)
Anion Gap: 7 (ref 7–16)
BUN: 6 mg/dL — ABNORMAL LOW (ref 7–18)
Calcium, Total: 7.3 mg/dL — ABNORMAL LOW (ref 8.5–10.1)
Chloride: 100 mmol/L (ref 98–107)
Co2: 27 mmol/L (ref 21–32)
Glucose: 181 mg/dL — ABNORMAL HIGH (ref 65–99)
Osmolality: 270 (ref 275–301)
Potassium: 4.5 mmol/L (ref 3.5–5.1)
SGOT(AST): 13 U/L — ABNORMAL LOW (ref 15–37)
SGPT (ALT): 9 U/L — ABNORMAL LOW (ref 12–78)
Sodium: 134 mmol/L — ABNORMAL LOW (ref 136–145)

## 2012-05-26 LAB — MAGNESIUM: Magnesium: 1.5 mg/dL — ABNORMAL LOW

## 2012-05-27 LAB — CBC WITH DIFFERENTIAL/PLATELET
Basophil %: 0.5 %
Eosinophil #: 0.1 10*3/uL (ref 0.0–0.7)
HGB: 8 g/dL — ABNORMAL LOW (ref 12.0–16.0)
Lymphocyte %: 3.1 %
MCH: 27.5 pg (ref 26.0–34.0)
MCV: 81 fL (ref 80–100)
Monocyte #: 0.5 x10 3/mm (ref 0.2–0.9)
Monocyte %: 5.1 %
Neutrophil #: 9 10*3/uL — ABNORMAL HIGH (ref 1.4–6.5)
Neutrophil %: 90.7 %
Platelet: 673 10*3/uL — ABNORMAL HIGH (ref 150–440)
RBC: 2.89 10*6/uL — ABNORMAL LOW (ref 3.80–5.20)
RDW: 16.7 % — ABNORMAL HIGH (ref 11.5–14.5)

## 2012-05-28 LAB — CBC WITH DIFFERENTIAL/PLATELET
Basophil %: 0.6 %
Eosinophil #: 0.1 10*3/uL (ref 0.0–0.7)
Eosinophil %: 0.7 %
HCT: 27.3 % — ABNORMAL LOW (ref 35.0–47.0)
Lymphocyte #: 0.4 10*3/uL — ABNORMAL LOW (ref 1.0–3.6)
Lymphocyte %: 4.7 %
MCH: 28.2 pg (ref 26.0–34.0)
MCHC: 34.8 g/dL (ref 32.0–36.0)
MCV: 81 fL (ref 80–100)
Monocyte %: 5.1 %
Neutrophil #: 7.4 10*3/uL — ABNORMAL HIGH (ref 1.4–6.5)
Neutrophil %: 88.9 %
RBC: 3.37 10*6/uL — ABNORMAL LOW (ref 3.80–5.20)

## 2012-05-28 LAB — BASIC METABOLIC PANEL
Anion Gap: 6 — ABNORMAL LOW (ref 7–16)
Calcium, Total: 8.2 mg/dL — ABNORMAL LOW (ref 8.5–10.1)
Creatinine: 1.19 mg/dL (ref 0.60–1.30)
EGFR (African American): 60 — ABNORMAL LOW
EGFR (Non-African Amer.): 52 — ABNORMAL LOW
Glucose: 139 mg/dL — ABNORMAL HIGH (ref 65–99)
Potassium: 4.5 mmol/L (ref 3.5–5.1)

## 2012-05-28 LAB — MAGNESIUM: Magnesium: 1.6 mg/dL — ABNORMAL LOW

## 2012-05-30 ENCOUNTER — Inpatient Hospital Stay: Payer: Self-pay | Admitting: Surgery

## 2012-05-30 LAB — CBC
MCH: 28.3 pg (ref 26.0–34.0)
MCHC: 35 g/dL (ref 32.0–36.0)
MCV: 81 fL (ref 80–100)
Platelet: 696 10*3/uL — ABNORMAL HIGH (ref 150–440)
RDW: 16.6 % — ABNORMAL HIGH (ref 11.5–14.5)

## 2012-05-30 LAB — COMPREHENSIVE METABOLIC PANEL
Alkaline Phosphatase: 121 U/L (ref 50–136)
Anion Gap: 6 — ABNORMAL LOW (ref 7–16)
BUN: 11 mg/dL (ref 7–18)
Bilirubin,Total: 0.3 mg/dL (ref 0.2–1.0)
Calcium, Total: 8.2 mg/dL — ABNORMAL LOW (ref 8.5–10.1)
Chloride: 91 mmol/L — ABNORMAL LOW (ref 98–107)
Co2: 28 mmol/L (ref 21–32)
Creatinine: 1.41 mg/dL — ABNORMAL HIGH (ref 0.60–1.30)
EGFR (Non-African Amer.): 42 — ABNORMAL LOW
Glucose: 163 mg/dL — ABNORMAL HIGH (ref 65–99)
Osmolality: 254 (ref 275–301)
Potassium: 4 mmol/L (ref 3.5–5.1)
SGPT (ALT): 45 U/L (ref 12–78)
Sodium: 125 mmol/L — ABNORMAL LOW (ref 136–145)

## 2012-05-30 LAB — CULTURE, BLOOD (SINGLE)

## 2012-05-31 LAB — BASIC METABOLIC PANEL
Anion Gap: 10 (ref 7–16)
BUN: 11 mg/dL (ref 7–18)
Calcium, Total: 7.6 mg/dL — ABNORMAL LOW (ref 8.5–10.1)
Chloride: 108 mmol/L — ABNORMAL HIGH (ref 98–107)
EGFR (Non-African Amer.): 56 — ABNORMAL LOW
Glucose: 49 mg/dL — ABNORMAL LOW (ref 65–99)
Osmolality: 274 (ref 275–301)
Sodium: 139 mmol/L (ref 136–145)

## 2012-05-31 LAB — HEMATOCRIT: HCT: 21.8 % — ABNORMAL LOW (ref 35.0–47.0)

## 2012-05-31 LAB — CBC WITH DIFFERENTIAL/PLATELET
Eosinophil #: 0 10*3/uL (ref 0.0–0.7)
Lymphocyte %: 3 %
MCH: 28.8 pg (ref 26.0–34.0)
MCHC: 34.9 g/dL (ref 32.0–36.0)
Monocyte #: 0.5 x10 3/mm (ref 0.2–0.9)
Monocyte %: 5.4 %
Neutrophil #: 8.9 10*3/uL — ABNORMAL HIGH (ref 1.4–6.5)
Neutrophil %: 90.7 %
Platelet: 528 10*3/uL — ABNORMAL HIGH (ref 150–440)
WBC: 9.8 10*3/uL (ref 3.6–11.0)

## 2012-06-01 ENCOUNTER — Ambulatory Visit: Payer: Self-pay | Admitting: Oncology

## 2012-06-01 LAB — BASIC METABOLIC PANEL
Anion Gap: 7 (ref 7–16)
Chloride: 110 mmol/L — ABNORMAL HIGH (ref 98–107)
Co2: 22 mmol/L (ref 21–32)
Creatinine: 1.17 mg/dL (ref 0.60–1.30)
EGFR (Non-African Amer.): 53 — ABNORMAL LOW
Sodium: 139 mmol/L (ref 136–145)

## 2012-06-01 LAB — CBC WITH DIFFERENTIAL/PLATELET
Basophil #: 0 10*3/uL (ref 0.0–0.1)
Eosinophil %: 0.7 %
HCT: 19.8 % — ABNORMAL LOW (ref 35.0–47.0)
Lymphocyte #: 0.3 10*3/uL — ABNORMAL LOW (ref 1.0–3.6)
Lymphocyte %: 4.6 %
MCHC: 35.2 g/dL (ref 32.0–36.0)
MCV: 82 fL (ref 80–100)
Monocyte #: 0.5 x10 3/mm (ref 0.2–0.9)
Neutrophil #: 6 10*3/uL (ref 1.4–6.5)
Neutrophil %: 86.6 %
Platelet: 501 10*3/uL — ABNORMAL HIGH (ref 150–440)
RBC: 2.41 10*6/uL — ABNORMAL LOW (ref 3.80–5.20)

## 2012-06-01 LAB — HEMATOCRIT: HCT: 26.9 % — ABNORMAL LOW (ref 35.0–47.0)

## 2012-06-01 LAB — MAGNESIUM: Magnesium: 1.1 mg/dL — ABNORMAL LOW

## 2012-06-01 LAB — PHOSPHORUS: Phosphorus: 2.7 mg/dL (ref 2.5–4.9)

## 2012-06-02 LAB — HEMOGLOBIN: HGB: 11.4 g/dL — ABNORMAL LOW (ref 12.0–16.0)

## 2012-06-03 LAB — CBC WITH DIFFERENTIAL/PLATELET
Basophil #: 0.1 10*3/uL (ref 0.0–0.1)
Basophil %: 0.7 %
Eosinophil #: 0.1 10*3/uL (ref 0.0–0.7)
Eosinophil %: 0.3 %
HGB: 9.6 g/dL — ABNORMAL LOW (ref 12.0–16.0)
Lymphocyte #: 0.5 10*3/uL — ABNORMAL LOW (ref 1.0–3.6)
Lymphocyte %: 2.7 %
MCH: 28.6 pg (ref 26.0–34.0)
MCHC: 34.3 g/dL (ref 32.0–36.0)
Monocyte #: 0.9 x10 3/mm (ref 0.2–0.9)
Neutrophil #: 18.2 10*3/uL — ABNORMAL HIGH (ref 1.4–6.5)
Neutrophil %: 91.9 %
Platelet: 398 10*3/uL (ref 150–440)
RBC: 3.34 10*6/uL — ABNORMAL LOW (ref 3.80–5.20)
RDW: 14.9 % — ABNORMAL HIGH (ref 11.5–14.5)

## 2012-06-03 LAB — BASIC METABOLIC PANEL
BUN: 8 mg/dL (ref 7–18)
Calcium, Total: 7 mg/dL — CL (ref 8.5–10.1)
Chloride: 104 mmol/L (ref 98–107)
Creatinine: 1.27 mg/dL (ref 0.60–1.30)
Osmolality: 265 (ref 275–301)
Potassium: 4.7 mmol/L (ref 3.5–5.1)
Sodium: 130 mmol/L — ABNORMAL LOW (ref 136–145)

## 2012-06-03 LAB — PATHOLOGY REPORT

## 2012-06-03 LAB — PHOSPHORUS: Phosphorus: 3 mg/dL (ref 2.5–4.9)

## 2012-06-04 LAB — BASIC METABOLIC PANEL
Anion Gap: 6 — ABNORMAL LOW (ref 7–16)
BUN: 13 mg/dL (ref 7–18)
Chloride: 105 mmol/L (ref 98–107)
EGFR (African American): >60
Glucose: 309 mg/dL — ABNORMAL HIGH (ref 65–99)

## 2012-06-04 LAB — CBC WITH DIFFERENTIAL/PLATELET
HGB: 7.5 g/dL — ABNORMAL LOW (ref 12.0–16.0)
Lymphocyte #: 0.5 10*3/uL — ABNORMAL LOW (ref 1.0–3.6)
Lymphocyte %: 3.5 %
MCH: 28.5 pg (ref 26.0–34.0)
MCV: 85 fL (ref 80–100)
Monocyte %: 3 %
Neutrophil #: 14.5 10*3/uL — ABNORMAL HIGH (ref 1.4–6.5)
Neutrophil %: 92.3 %
RBC: 2.64 10*6/uL — ABNORMAL LOW (ref 3.80–5.20)
RDW: 14.7 % — ABNORMAL HIGH (ref 11.5–14.5)
WBC: 15.7 10*3/uL — ABNORMAL HIGH (ref 3.6–11.0)

## 2012-06-04 LAB — MAGNESIUM: Magnesium: 1.6 mg/dL — ABNORMAL LOW

## 2012-06-05 LAB — RETICULOCYTES: Reticulocyte: 1.61 %

## 2012-06-05 LAB — CBC WITH DIFFERENTIAL/PLATELET
Basophil #: 0.1 10*3/uL (ref 0.0–0.1)
Basophil %: 0.6 %
Lymphocyte #: 0.3 10*3/uL — ABNORMAL LOW (ref 1.0–3.6)
Lymphocyte %: 3.2 %
MCH: 29 pg (ref 26.0–34.0)
MCHC: 35.2 g/dL (ref 32.0–36.0)
MCV: 83 fL (ref 80–100)
Neutrophil #: 9.7 10*3/uL — ABNORMAL HIGH (ref 1.4–6.5)
Neutrophil %: 90.5 %

## 2012-06-05 LAB — BASIC METABOLIC PANEL
Anion Gap: 5 — ABNORMAL LOW (ref 7–16)
BUN: 14 mg/dL (ref 7–18)
Calcium, Total: 7.1 mg/dL — ABNORMAL LOW (ref 8.5–10.1)
Chloride: 103 mmol/L (ref 98–107)
EGFR (African American): >60
EGFR (Non-African Amer.): >60
Osmolality: 271 (ref 275–301)
Potassium: 3.9 mmol/L (ref 3.5–5.1)

## 2012-06-05 LAB — PROTIME-INR: INR: 1

## 2012-06-05 LAB — MAGNESIUM: Magnesium: 1.4 mg/dL — ABNORMAL LOW

## 2012-06-05 LAB — WOUND CULTURE

## 2012-06-05 LAB — IRON AND TIBC
Iron Bind.Cap.(Total): 117 ug/dL — ABNORMAL LOW (ref 250–450)
Iron Saturation: 20 %
Iron: 23 ug/dL — ABNORMAL LOW (ref 50–170)
Unbound Iron-Bind.Cap.: 94 ug/dL

## 2012-06-05 LAB — PHOSPHORUS: Phosphorus: 2.3 mg/dL — ABNORMAL LOW (ref 2.5–4.9)

## 2012-06-05 LAB — APTT: Activated PTT: 43 secs — ABNORMAL HIGH (ref 23.6–35.9)

## 2012-06-05 LAB — LACTATE DEHYDROGENASE: LDH: 288 U/L — ABNORMAL HIGH (ref 81–246)

## 2012-06-06 LAB — CBC WITH DIFFERENTIAL/PLATELET
Basophil #: 0 10*3/uL (ref 0.0–0.1)
Basophil %: 0.2 %
Eosinophil #: 0.1 10*3/uL (ref 0.0–0.7)
HGB: 9.9 g/dL — ABNORMAL LOW (ref 12.0–16.0)
Lymphocyte #: 0.4 10*3/uL — ABNORMAL LOW (ref 1.0–3.6)
Lymphocyte %: 4.3 %
Monocyte #: 0.3 x10 3/mm (ref 0.2–0.9)
Monocyte %: 3 %
Neutrophil #: 7.8 10*3/uL — ABNORMAL HIGH (ref 1.4–6.5)
RBC: 3.39 10*6/uL — ABNORMAL LOW (ref 3.80–5.20)
WBC: 8.5 10*3/uL (ref 3.6–11.0)

## 2012-06-06 LAB — BASIC METABOLIC PANEL
Anion Gap: 6 — ABNORMAL LOW (ref 7–16)
Co2: 26 mmol/L (ref 21–32)
EGFR (African American): >60
Glucose: 183 mg/dL — ABNORMAL HIGH (ref 65–99)
Osmolality: 271 (ref 275–301)
Sodium: 133 mmol/L — ABNORMAL LOW (ref 136–145)

## 2012-06-06 LAB — IRON AND TIBC: Iron: 353 ug/dL — ABNORMAL HIGH (ref 50–170)

## 2012-06-06 LAB — FERRITIN: Ferritin (ARMC): 1159 ng/mL — ABNORMAL HIGH (ref 8–388)

## 2012-06-07 LAB — CBC WITH DIFFERENTIAL/PLATELET
Eosinophil %: 0.6 %
HCT: 30 % — ABNORMAL LOW (ref 35.0–47.0)
HGB: 10.6 g/dL — ABNORMAL LOW (ref 12.0–16.0)
Lymphocyte #: 0.3 10*3/uL — ABNORMAL LOW (ref 1.0–3.6)
Lymphocyte %: 3.8 %
MCHC: 35.2 g/dL (ref 32.0–36.0)
MCV: 83 fL (ref 80–100)
Neutrophil #: 7.7 10*3/uL — ABNORMAL HIGH (ref 1.4–6.5)
Neutrophil %: 90.6 %
Platelet: 379 10*3/uL (ref 150–440)
RBC: 3.62 10*6/uL — ABNORMAL LOW (ref 3.80–5.20)
WBC: 8.5 10*3/uL (ref 3.6–11.0)

## 2012-06-07 LAB — BASIC METABOLIC PANEL
Anion Gap: 7 (ref 7–16)
BUN: 12 mg/dL (ref 7–18)
Chloride: 100 mmol/L (ref 98–107)
Creatinine: 0.96 mg/dL (ref 0.60–1.30)
EGFR (Non-African Amer.): >60
Glucose: 189 mg/dL — ABNORMAL HIGH (ref 65–99)
Osmolality: 273 (ref 275–301)
Potassium: 4 mmol/L (ref 3.5–5.1)
Sodium: 134 mmol/L — ABNORMAL LOW (ref 136–145)

## 2012-06-08 LAB — BASIC METABOLIC PANEL
Anion Gap: 7 (ref 7–16)
BUN: 12 mg/dL (ref 7–18)
Co2: 28 mmol/L (ref 21–32)
Creatinine: 1.25 mg/dL (ref 0.60–1.30)
EGFR (African American): 56 — ABNORMAL LOW
Glucose: 116 mg/dL — ABNORMAL HIGH (ref 65–99)
Potassium: 4 mmol/L (ref 3.5–5.1)

## 2012-06-10 LAB — CBC WITH DIFFERENTIAL/PLATELET
Basophil %: 0.4 %
HGB: 9.4 g/dL — ABNORMAL LOW (ref 12.0–16.0)
Lymphocyte #: 0.5 10*3/uL — ABNORMAL LOW (ref 1.0–3.6)
MCHC: 33.8 g/dL (ref 32.0–36.0)
MCV: 85 fL (ref 80–100)
Monocyte #: 0.4 x10 3/mm (ref 0.2–0.9)
Neutrophil %: 84.9 %
RDW: 14.5 % (ref 11.5–14.5)

## 2012-06-10 LAB — COMPREHENSIVE METABOLIC PANEL
Bilirubin,Total: 0.2 mg/dL (ref 0.2–1.0)
Chloride: 100 mmol/L (ref 98–107)
Co2: 32 mmol/L (ref 21–32)
EGFR (African American): 51 — ABNORMAL LOW
EGFR (Non-African Amer.): 44 — ABNORMAL LOW
SGOT(AST): 36 U/L (ref 15–37)
SGPT (ALT): 66 U/L (ref 12–78)

## 2012-07-01 ENCOUNTER — Ambulatory Visit: Payer: Self-pay | Admitting: Oncology

## 2013-10-29 IMAGING — CT CT SIM MISC
1 series · 16 of 32 positions shown, 20 images · non-contrast
Comparison: none

[Series 2: — · axial · 1.17mm/px · z∈[-1138,-805]mm · 16 of 119 slices shown, 20 images]
[im 8/119  soft-tissue]
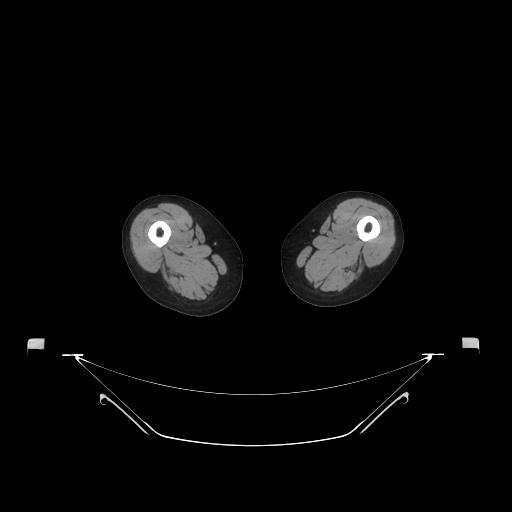
[im 8/119  bone]
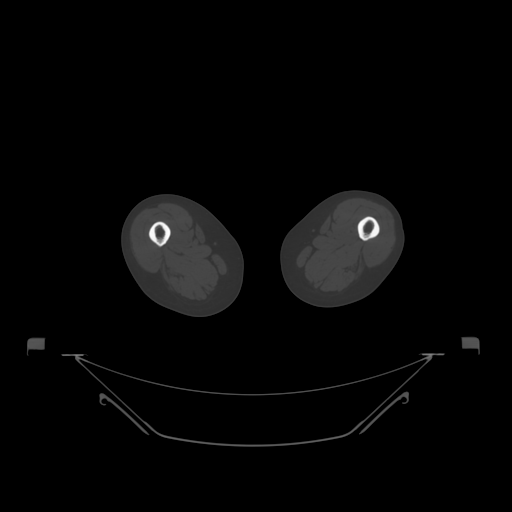
[im 16/119  soft-tissue]
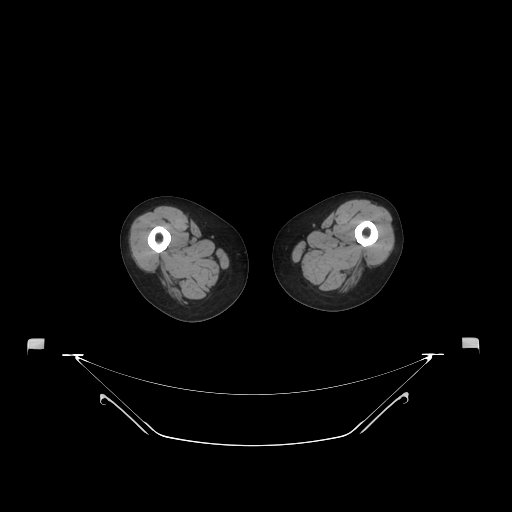
[im 23/119  soft-tissue]
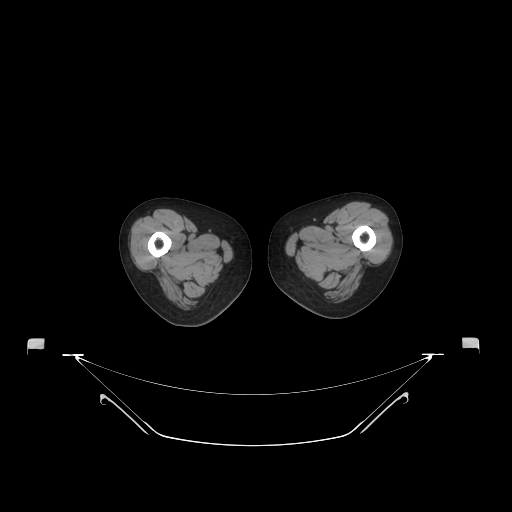
[im 31/119  soft-tissue]
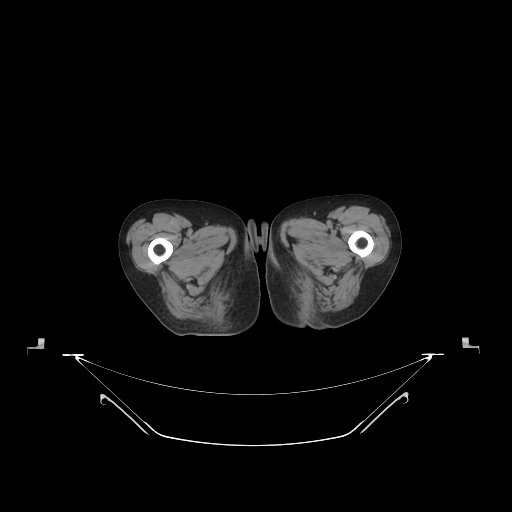
[im 39/119  soft-tissue]
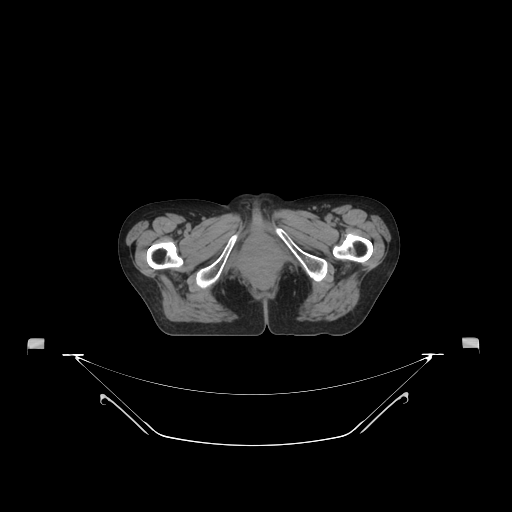
[im 46/119  soft-tissue]
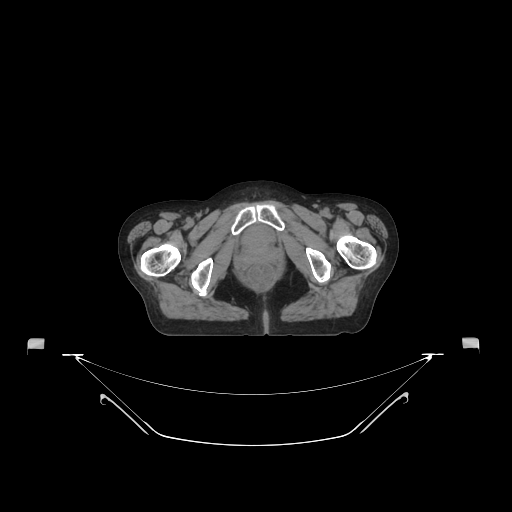
[im 54/119  soft-tissue]
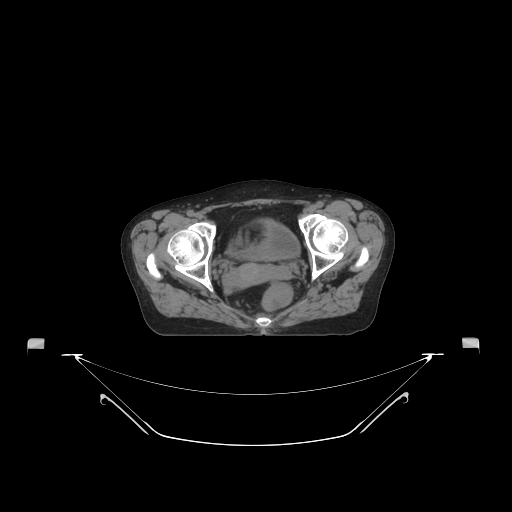
[im 65/119  soft-tissue]
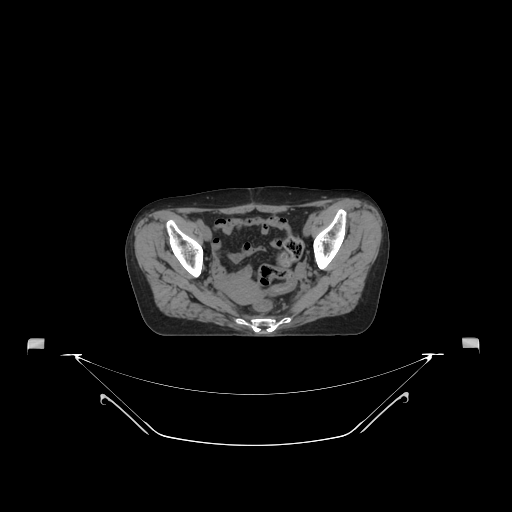
[im 73/119  soft-tissue]
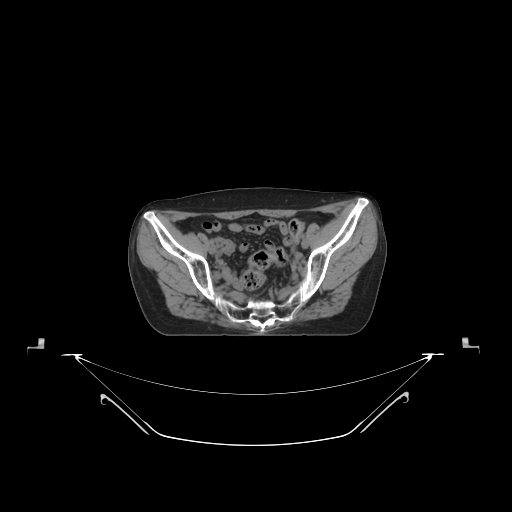
[im 73/119  bone]
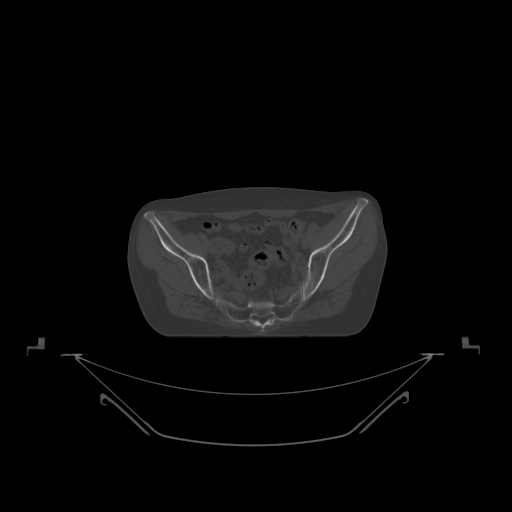
[im 80/119  soft-tissue]
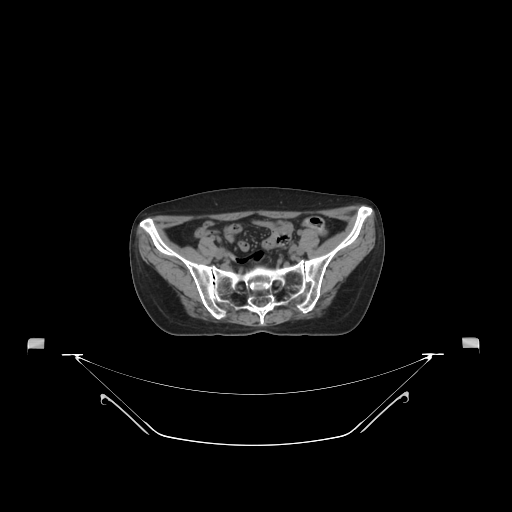
[im 88/119  soft-tissue]
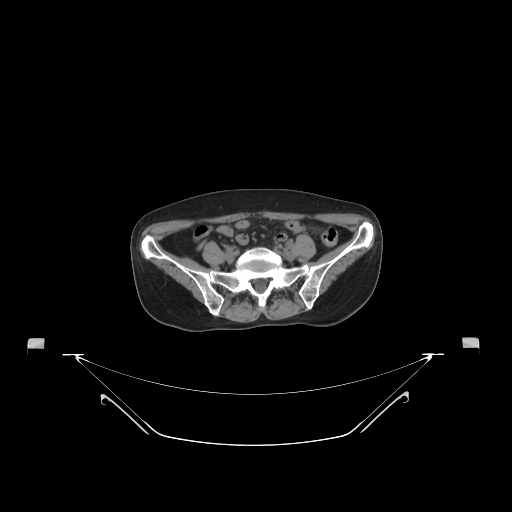
[im 96/119  soft-tissue]
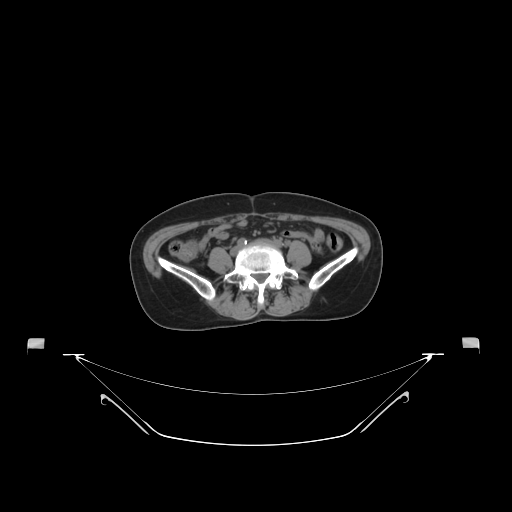
[im 103/119  soft-tissue]
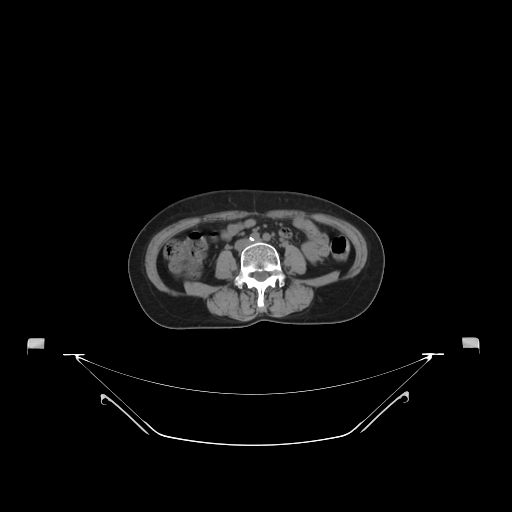
[im 103/119  lung]
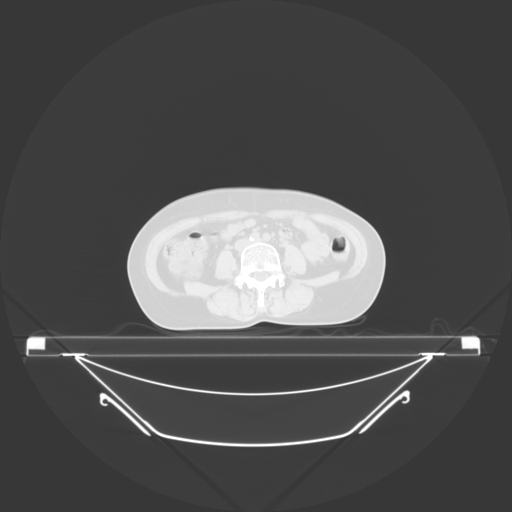
[im 107/119  lung]
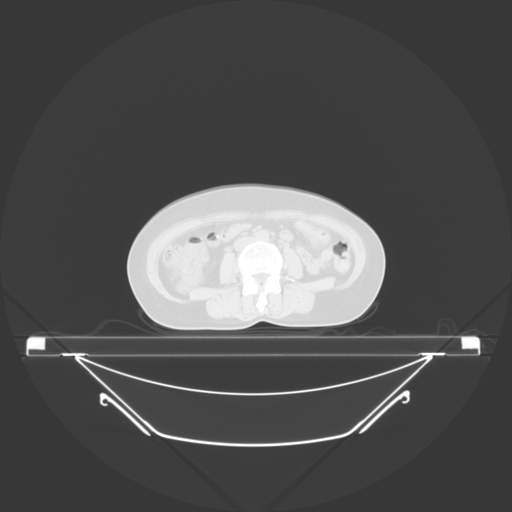
[im 111/119  soft-tissue]
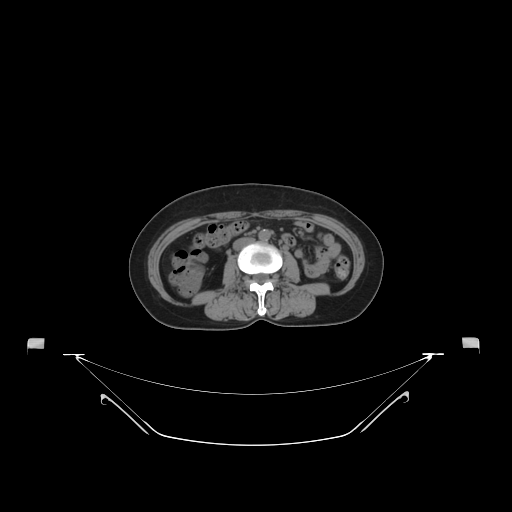
[im 111/119  lung]
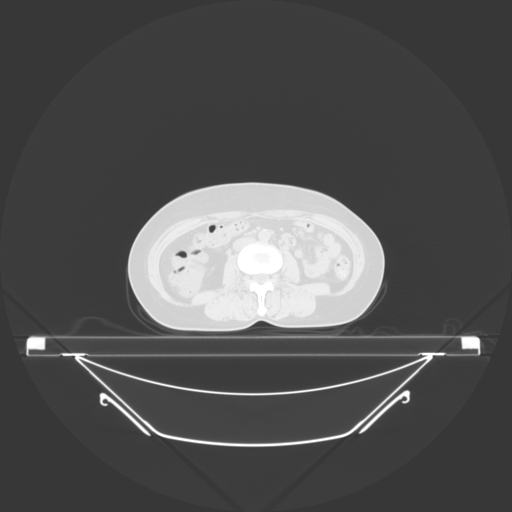
[im 115/119  lung]
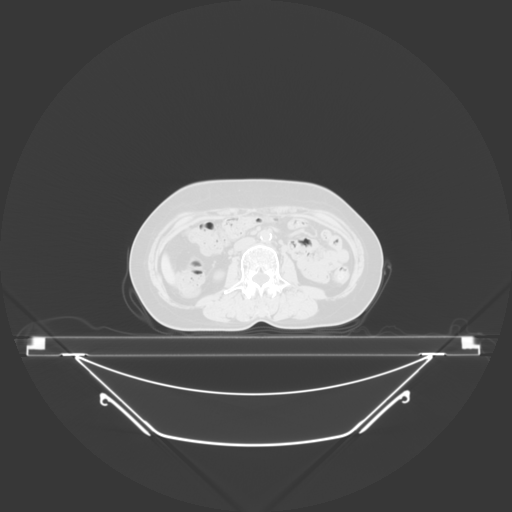

[16 of 32 positions shown; findings below may reference images not displayed]

IMAGES IMPORTED FROM THE [employer] WORKFLOW SYSTEM
NO DICTATION FOR STUDY

## 2013-11-03 IMAGING — CR DG CHEST 1V PORT
1 series · 1 of 1 positions shown · non-contrast
Comparison: none

REASON FOR EXAM: check catheter placement
COMMENTS:

PROCEDURE:     DXR - DXR PORTABLE CHEST SINGLE VIEW  - January 16, 2011  [DATE]
RESULT:     Comparison: None.

[portable]
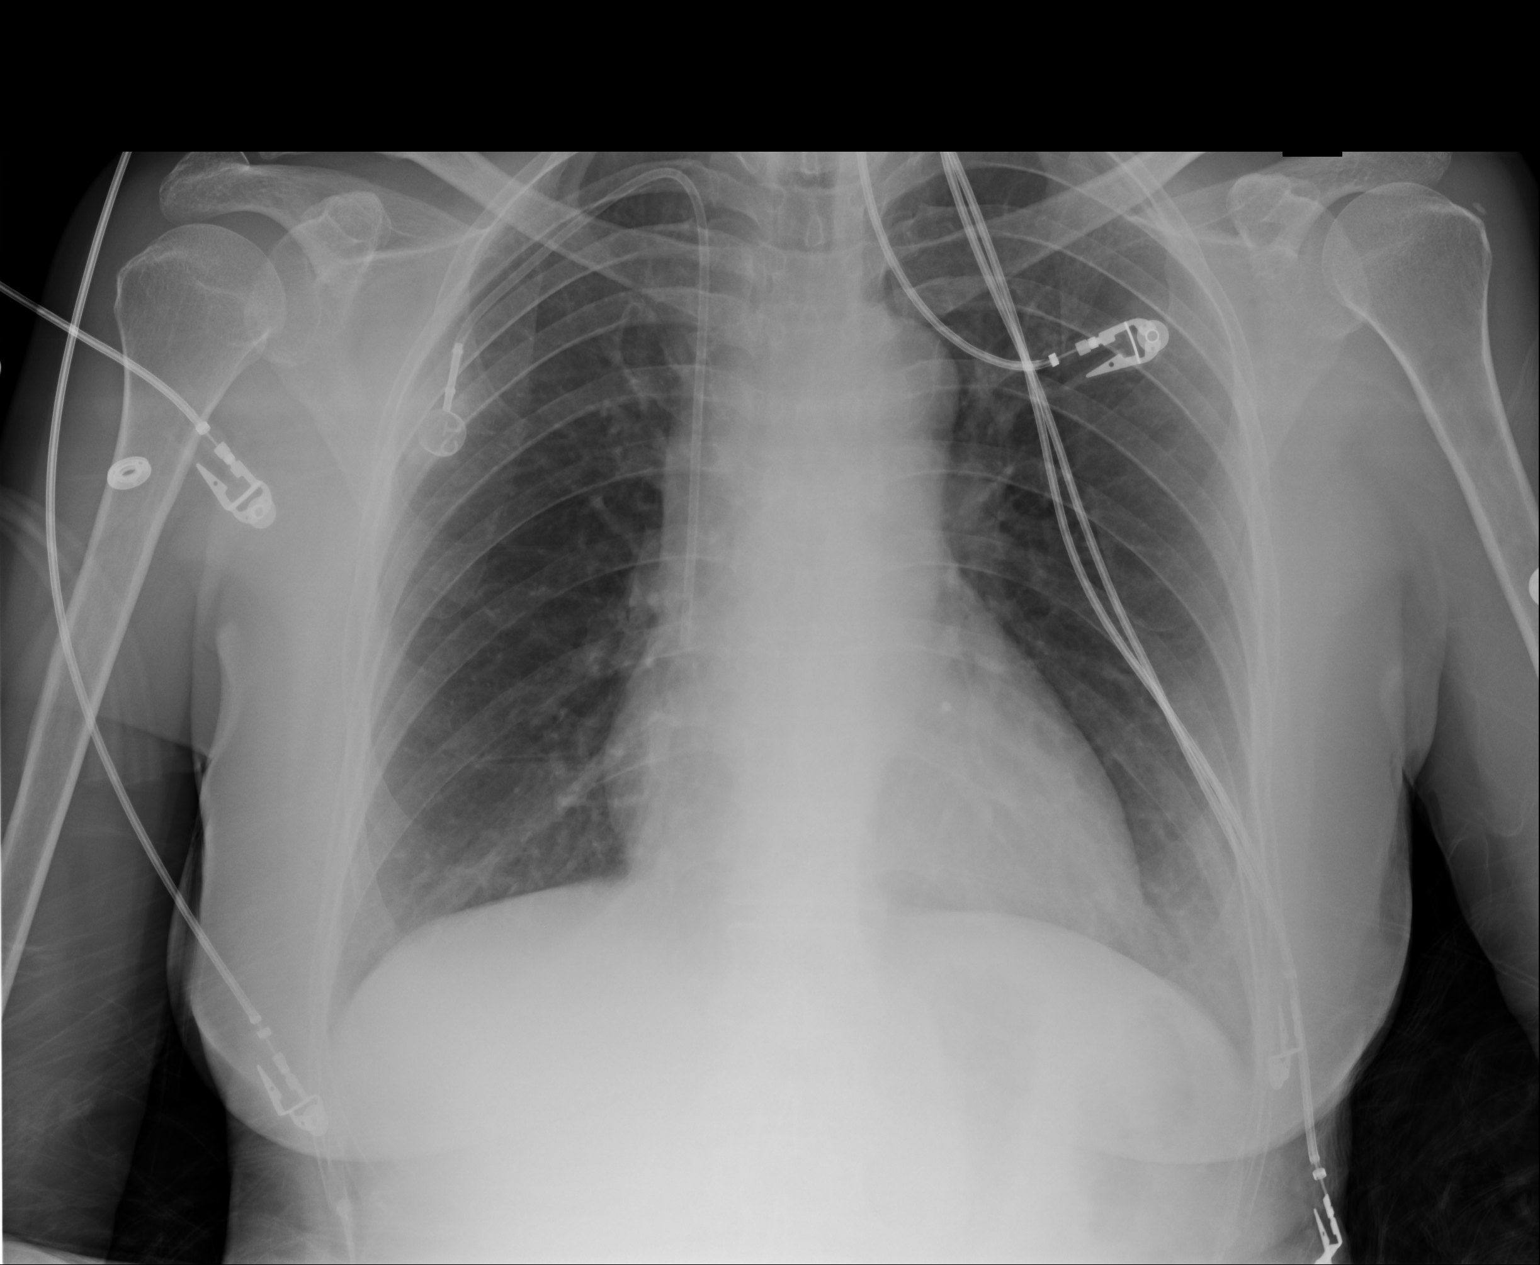

[1 of 1 positions shown; findings below may reference images not displayed]

FINDINGS: Right IJ portacatheter tip terminates at the cavoatrial junction. No
pneumothorax. Heart and mediastinum are within normal limits. No focal
pulmonary opacities. Small calcific density along the superolateral left
humeral head may be secondary to calcific tendinitis.
IMPRESSION: Right portacatheter tip terminates at the cavoatrial junction.

## 2014-04-22 IMAGING — US ABDOMEN ULTRASOUND LIMITED
1 series · 14 of 25 positions shown · non-contrast
Comparison: none

REASON FOR EXAM: abd pain elevated lipase
COMMENTS:   Body Site: GB and Fossa, CBD, Head of Pancreas; Right Upper
Quad; Panc

[Series 1: abdomen ultrasound limited · 0.28mm/px · 14 of 48 slices shown]
[im 1/48]
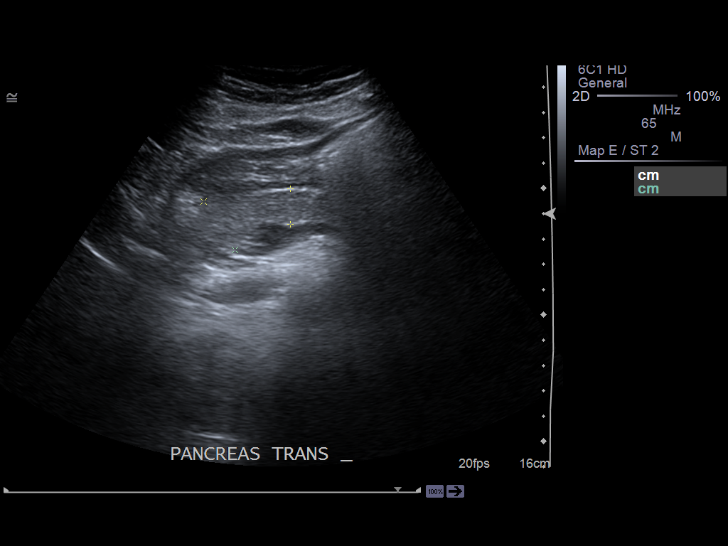
[im 4/48]
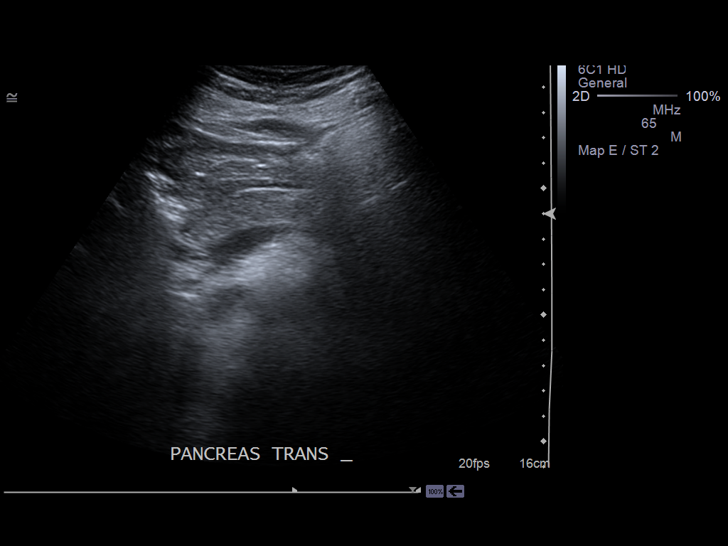
[im 8/48]
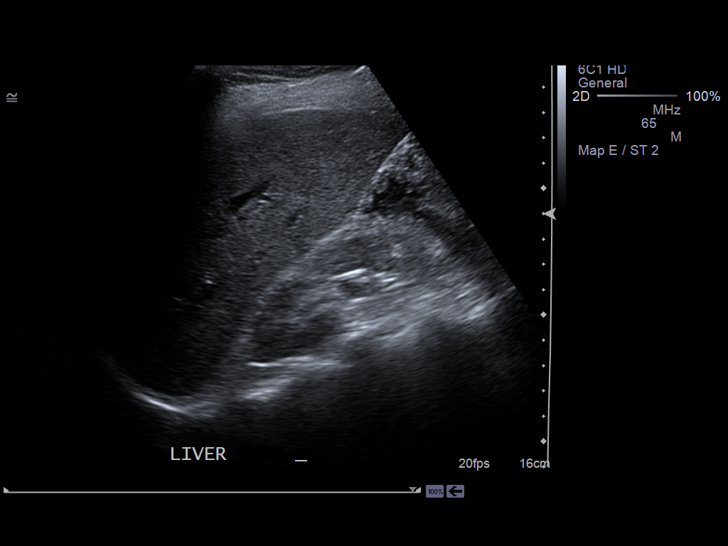
[im 12/48]
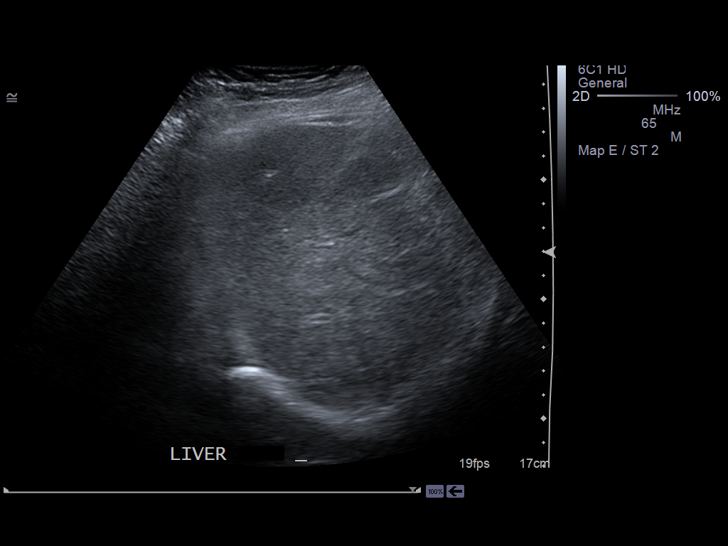
[im 16/48]
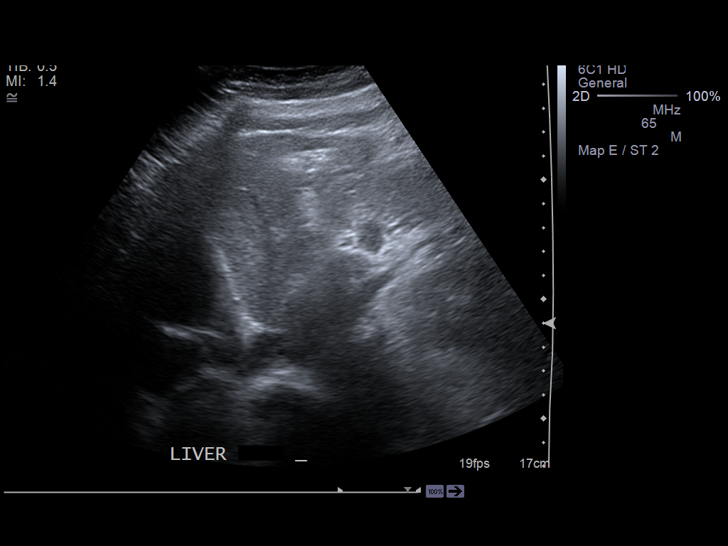
[im 18/48]
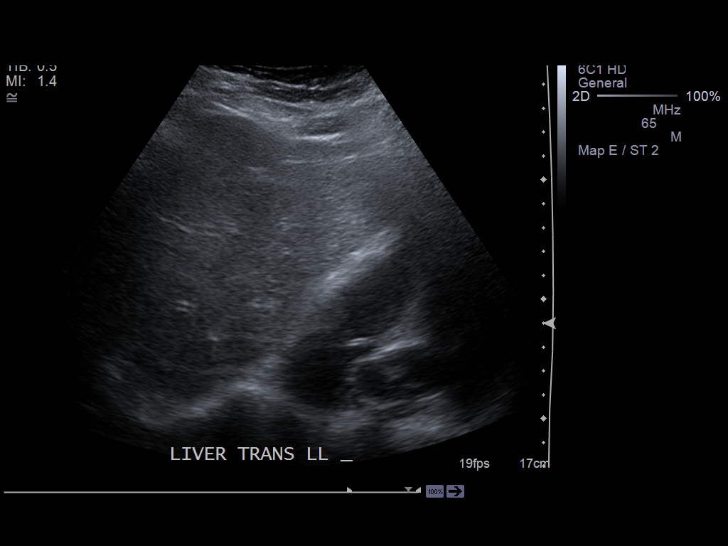
[im 22/48]
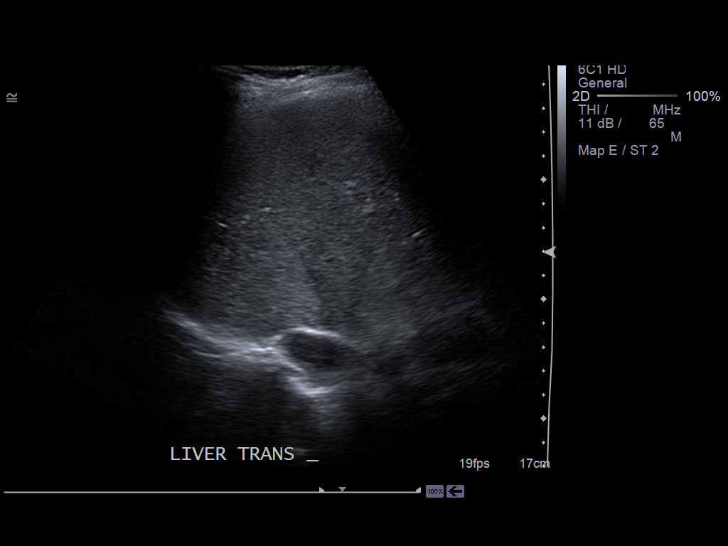
[im 26/48]
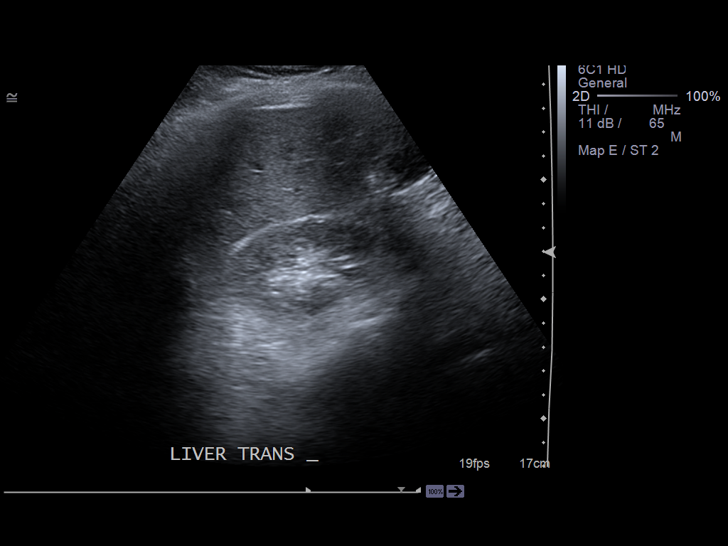
[im 30/48]
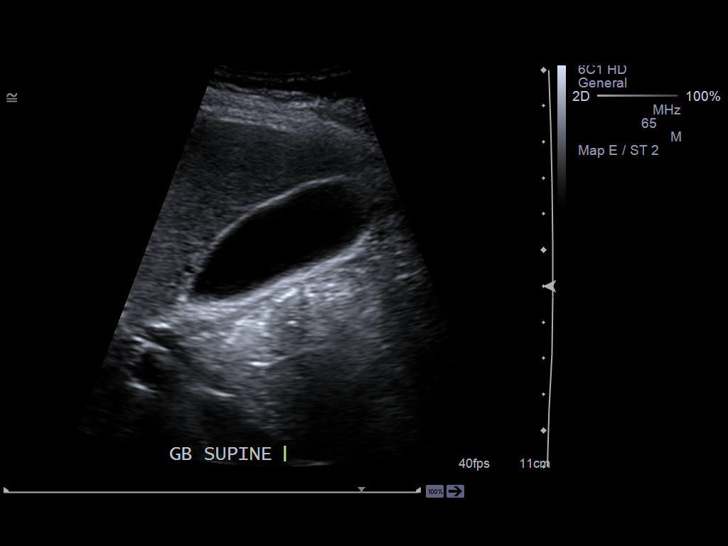
[im 32/48]
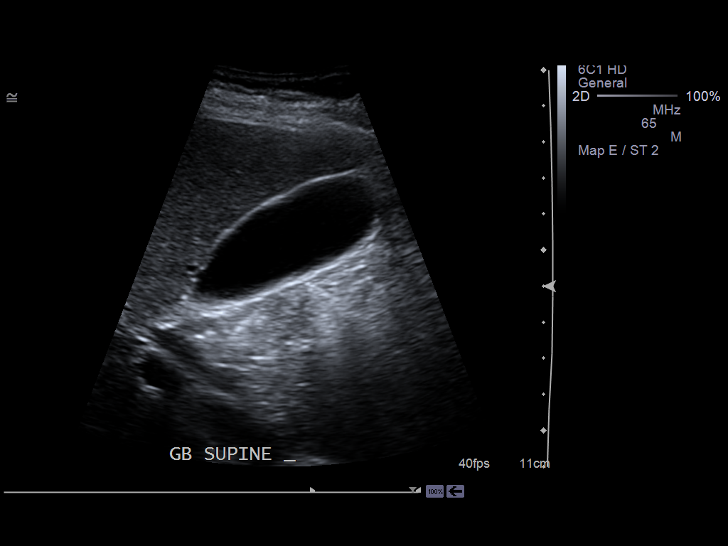
[im 36/48]
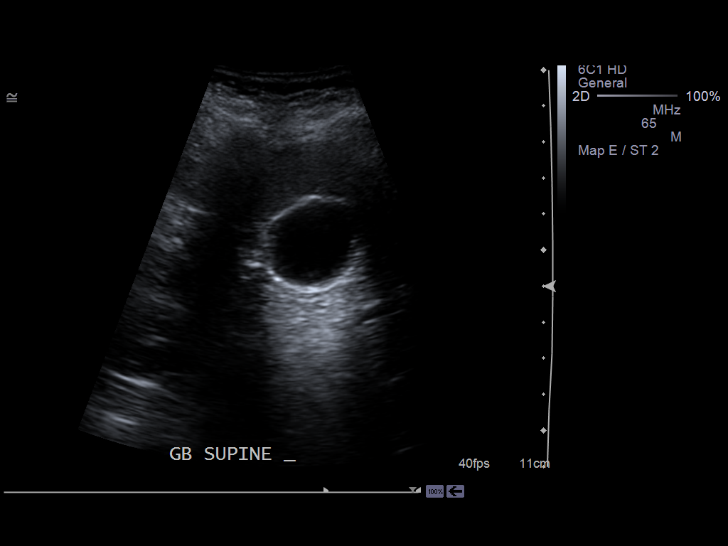
[im 40/48]
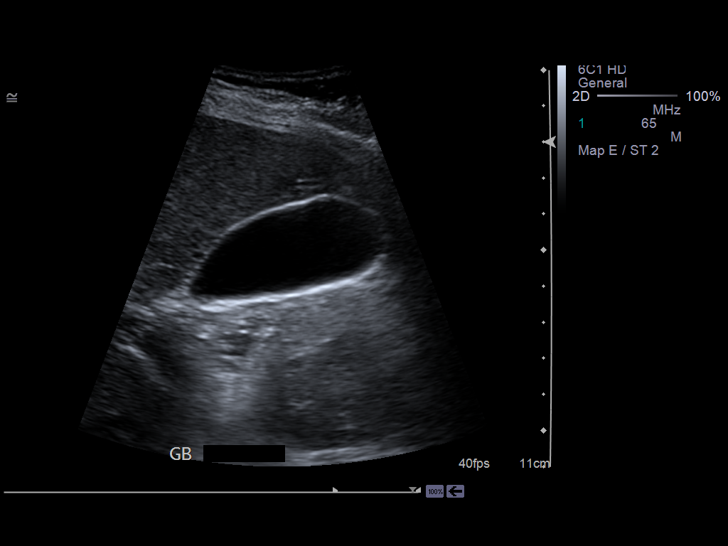
[im 44/48]
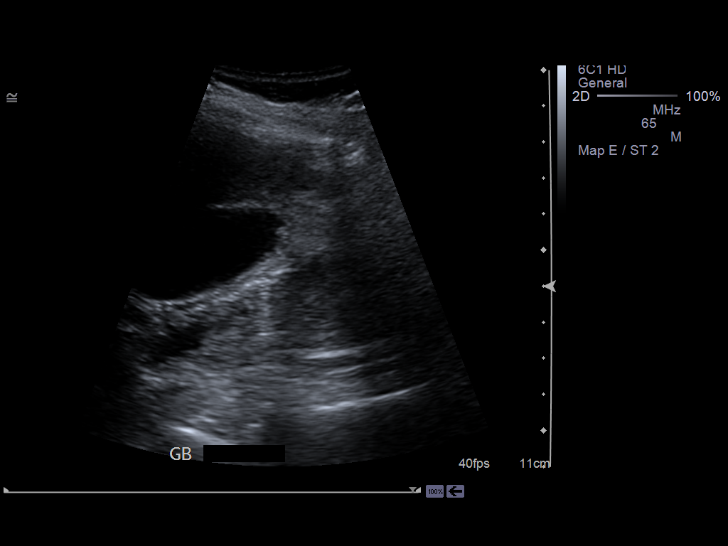
[im 48/48]
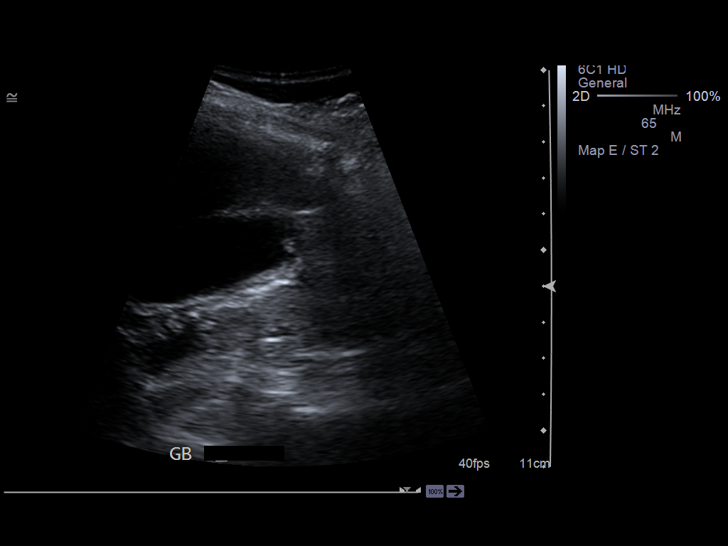

[14 of 25 positions shown; findings below may reference images not displayed]

PROCEDURE:     US  - US ABDOMEN LIMITED SURVEY  - July 05, 2011  [DATE]

RESULT:     The liver exhibits normal echotexture with no focal mass or
ductal dilation. Portal venous flow is normal in direction toward the liver.
The gallbladder is adequately distended and contains a small amount of
mobile sludge. No discrete stones are demonstrated. There is no positive
sonographic Murphy's sign. The common bile duct is normal at 5.4 mm in
diameter. Limited evaluation of the pancreatic head exhibits no acute
abnormality.
IMPRESSION: There is a small amount of sludge within the gallbladder.
Otherwise this limited study demonstrates no acute abnormality.

## 2014-04-23 NOTE — Consult Note (Signed)
CC: ischemic colitis.  Pt sitting up starting to eat breakfast.  Translation per son.  She is eating about half her meals, glucose somewhat erratic 49-160.  VSS afebrile, TPN labs OK.  No vomiting, no bleeding, hgb 10.7.  No new suggestions, seems like slow but continual improvement.  Electronic Signatures: Scot JunElliott, Alleah Dearman T (MD)  (Signed on 08-Jun-14 10:01)  Authored  Last Updated: 08-Jun-14 10:01 by Scot JunElliott, Anivea Velasques T (MD)

## 2014-04-23 NOTE — H&P (Signed)
PATIENT NAMEKAE, LAUMAN MR#:  [id number] DATE OF BIRTH:  Feb 10, 1955  DATE OF ADMISSION:  05/30/2012  REASON FOR ADMISSION:  Diarrhea, bright red blood per rectum.   HISTORY OF PRESENT ILLNESS:  Ms. Wendell is a pleasant 59 year old female who is well-known to myself after being recently admitted for a long period of time.  She initially came in for ileostomy reversal of a diverting loop ileostomy produced following low anterior resection for rectal cancer.  She had also received chemotherapy and radiation prior to her surgery.  Her hospital course was complicated by a postoperative right colon perforation due to ischemia for which she received a right hemicolectomy with end ileostomy and mucous fistula.  She in addition had a postoperative left-sided colon ischemia with recurrent bleed which had resolved at the time of discharge.  She now presents after being discharged yesterday with large amounts of ostomy output, according to her has been emptied 4 times since she had dinner at about 7:00, and appearance is almost liquid.  In addition, she has had multiple bloody bowel movements through her left colon since approximately 3:00 this morning which she describes as large.  An official interpreter is not available and attempts previously to use telephone interpreters have been very unsatisfactory indicates that she does not understand the dialects of many of the telephone interpreters.  There is one that is used by the hospital who she understands well that we have been using while as an inpatient which we will attempt to use later.  In the meantime, her interpreter is her friend who she lives with and assists in her caretaking who we have met on multiple occasions.  Otherwise, no fevers, chills, night sweats, shortness of breath, cough, chest pain, abdominal pain, nausea, vomiting, was able to eat rice and water at home without difficulty.   PAST MEDICAL HISTORY:   1.  Rectal cancer, status post  low anterior resection.  2.  Recent ileostomy closure on 04/30/2012.  3.  Ischemic right colon requiring right hemicolectomy with perforation on 05/11/2012.  4.  Left-sided colonic ischemia and bleed which appeared to be resolved postoperatively.  5.  Diarrhea.  6.  Hyponatremia.  7.  Hyperchloremia.  8.  Hypercholesterolemia.  9.  Gastroesophageal reflux.  10.  Diabetes mellitus.  11.  Migraine headaches.  12.  Hypertension.  13.  History of right left-sided Port-A-Cath placement.  14.  History of tubal ligation in 1988.  15.  History of thyroidectomy in 1988.   OUTPATIENT MEDICATIONS:   1.  Simvastatin 20 mg by mouth daily. 2.  Omeprazole 20 mg by mouth daily.  3.  Magnesium oxide 400 mg by mouth twice daily.  4.  L-thyroxine 75 mcg by mouth daily.  5.  Iron sulfate 325 by mouth daily.    ALLERGIES:   1.  SHELLFISH.  2.  PENICILLIN.  3.  HYDROCODONE.   SOCIAL HISTORY:  Denies alcohol, denies smoking, is from [place].   FAMILY HISTORY:  Noncontributory in this case.   REVIEW OF SYSTEMS:  A 10 point review of systems is obtained as well as possible through nonmedical interpreter.  Pertinent positives and negatives as above.   PHYSICAL EXAMINATION: VITAL SIGNS:  Currently, pulse 96, blood pressure 140/65, however at presentation her heart rate was 123 beats per minute, but is improved with resuscitation.  GENERAL:  No acute distress.  Alert and oriented x 3.  HEAD:  Normocephalic, atraumatic.  EYES:  No scleral icterus.  No conjunctivitis.  FACE:  No obvious facial trauma.  Normal external nose, normal external ears.  CHEST:  Lungs clear to auscultation.  Moving air well.  HEART:  Regular rate and rhythm.  No murmurs, rubs or gallops.  ABDOMEN:  Soft, minimally tender, midline incision, clean, dry, intact with staples.  Right ostomy with bag containing significant liquid with flecks of stool.  Colostomy is healthy viable with some necrotic tissue which has been debrided and  with a large amount of clot.  EXTREMITIES:  Moves all extremities well.  Strength 5 out of 5.  NEUROLOGIC:  Sensation, cranial nerves II through XII grossly intact.  Sensation intact to all 4 extremities.   LABORATORY DATA:  Are as follows:  Significant for hemoglobin of 9, down from 9.5, hematocrit 25.7, platelets are 696, white cell count is 9.5.  Electrolytes are abnormal for sodium of 125, chloride of 91, creatinine of 1.41, glucose is 163, albumin here is 2.4.   ASSESSMENT AND PLAN:  Ms. Nash is a complicated unfortunate 59 year old female who has had multiple complications following ileostomy closure.  She now presents with a high ostomy output and bright red blood per rectum.  We will admit for resuscitation and to allow evaluation and resolution of high ileostomy output.  We will also evaluate bright red blood per rectum.  If continues, will likely require completion colectomy which I have explained to her.  We will await official medical interpreter as well and we will assist Dr. Netty Starring who has a relationship with our patient for assistance with medical needs.    ____________________________ Glena Norfolk Maclovia Uher, MD cal:ea D: 05/30/2012 06:05:46 ET T: 05/30/2012 06:51:17 ET JOB#: [id number]  cc: Harrell Gave A. Takeria Marquina, MD, <Dictator> Floyde Parkins MD ELECTRONICALLY SIGNED 06/04/2012 13:16

## 2014-04-23 NOTE — Consult Note (Signed)
No bleeding today, I will sign off, reconsult if I can be of help.  Electronic Signatures: Scot JunElliott, Adiana Smelcer T (MD)  (Signed on 21-May-14 17:41)  Authored  Last Updated: 21-May-14 17:41 by Scot JunElliott, Pietro Bonura T (MD)

## 2014-04-23 NOTE — Consult Note (Signed)
Pt CC rectal bleed.  The bleeding has slowed down and her hgb is better after transfusions.  She is eating a soft diet now.  Hopefully will not need rest of colon removed.  Electronic Signatures: Scot JunElliott, Kyleena Scheirer T (MD)  (Signed on 19-May-14 18:58)  Authored  Last Updated: 19-May-14 18:58 by Scot JunElliott, Rune Mendez T (MD)

## 2014-04-23 NOTE — Consult Note (Signed)
PATIENT NAME:  Laura Barajas, Laura Barajas MR#:  [id number] DATE OF BIRTH:  1955/04/25  DATE OF CONSULTATION:  05/04/2012  REFERRING PHYSICIAN:  Dr. Lorre Nick CONSULTING PHYSICIAN:  Duane Lope. Judithann Sheen, MD  REASON FRO CONSULTATION:  Shortness of breath and tachycardia.   HISTORY OF PRESENT ILLNESS: The patient is a 59 year old Asian female with a history of rectal cancer, status post chemotherapy and surgical excision in May 2013. She is followed by Dr. Doylene Canning. Dr. Burnadette Pop is her family physician. Was admitted on April 30 for ileostomy takedown by Dr. Anda Kraft and was again admitted postoperatively to the surgical service. While in the hospital postoperatively, the patient was noted to have some mild pancytopenia and Dr. Lorre Nick was consulted. During Dr. Paula Compton examination, the patient was complaining of shortness of breath with tachypnea. She was noted to be tachycardic. Medicine consultation was subsequently requested. The patient is complaining of "breathing fast." She states that she is tired. Denies actual chest pain. She speaks very little [place] and no interpreter is available. As a result, the history from the patient is limited.   PAST MEDICAL HISTORY: 1.  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  2.  Status post ileostomy takedown on 04/30/2012.  3.  Benign hypertension.  4.  Type 2 diabetes.  5.  Hyperlipidemia.  6.  Hypothyroidism.  7.  Gastroesophageal reflux disease.  8.  Chronic anemia.   MEDICATIONS: 1.  Zocor 20 mg p.o. daily.  2.  Synthroid 75 mcg p.o. daily.  3.  Omeprazole 20 mg p.o. b.i.d.  4.  Iron sulfate 325 mg p.o. daily.  5.  Zofran 4 mg p.o. q. 4 hours p.r.n. nausea and vomiting.   ALLERGIES: PENICILLIN AND SHELLFISH.   SOCIAL HISTORY: There is no apparent history of alcohol or tobacco abuse.   FAMILY HISTORY: Unknown due to the language barrier.   REVIEW OF SYSTEMS: Unable to obtain due to language barrier.   PHYSICAL  EXAMINATION: GENERAL: The patient is in no acute distress.  VITAL SIGNS: Vital signs are currently remarkable for a blood pressure of 120/81 with a heart rate of 100 and a respiratory rate of 32. She is afebrile. Sats are 96% on room air.  HEENT: Normocephalic, atraumatic. Pupils equally round and reactive to light and accommodation. Extraocular movements are intact. Sclerae  are anicteric. Conjunctivae are clear.  Oropharynx is clear.  NECK: Supple without JVD. No adenopathy or thyromegaly is noted.  LUNGS: Basilar rhonchi with no wheezes or rales. No dullness. Respiratory effort is normal.  CARDIAC: Regular rate and rhythm with normal S1 and S2. No significant rubs, murmurs or gallops. PMI is nondisplaced. Chest wall is nontender.  ABDOMEN: Soft but diffusely tender. No rebound or guarding. Bowel sounds are present. No hernias or bruits were noted.  EXTREMITIES: Without clubbing, cyanosis or edema. Pulses were 2+ bilaterally.  SKIN: Warm and dry without rash or lesions.  NEUROLOGIC: Cranial nerves II through XII grossly intact. Deep tendon reflexes were symmetric. Motor and sensory examination is nonfocal.  PSYCHIATRIC: Exam revealed a patient who was alert and oriented to person, place, and time. She is cooperative.   LABORATORY, DIAGNOSTIC AND RADIOLOGIC DATA:  Portable chest x-ray done approximately one hour ago revealed bibasilar atelectasis, but was otherwise unremarkable.   EKG revealed sinus rhythm with no acute ischemic changes. Her creatinine is 1.45 with a GFR of 41. Troponin 0.04. White count 2.6 with a hemoglobin of 10.4 and a platelet count of 141,000.   ASSESSMENT: 1.  Tachypnea.  2.  Shortness of breath.  3.  Tachycardia.  4.  Pancytopenia.  5.  Hypothyroidism.  6.  History of rectal cancer.  7.  Benign hypertension, stable.   PLAN: We will follow up on a D-dimer and proceed with V/Q scan testing tomorrow. We will follow serial cardiac enzymes and obtain an  echocardiogram. We will check a TSH and a magnesium level. We will give 2 grams of magnesium empirically now. Otherwise, continue same regimen.   Thank you for the consultation. We will continue to follow this patient with you while in the hospital. Please call if questions arise.   ____________________________ Duane LopeJeffrey D. Judithann SheenSparks, MD jds:cc D: 05/04/2012 17:19:55 ET T: 05/04/2012 18:03:17 ET JOB#: [id number]  cc: Duane LopeJeffrey D. Judithann SheenSparks, MD, <Dictator> Marisue IvanKanhka Linthavong, MD Kandra Graven Rodena Medin Rumaldo Difatta MD ELECTRONICALLY SIGNED 05/04/2012 21:24

## 2014-04-23 NOTE — Consult Note (Signed)
Pt CC is ischemic colitis.  Pt still passing small amts of blood and clots.  HGb came up well with 2 units and is not 9.7.  Surgeon and I discussed case today, prefer to see if bleeding will subside without further surgery if possible.  Tmax 100.2, alb 1.3  Follow hgb.  Electronic Signatures: Yahsir Wickens,Scot Jun Majesty Stehlin T (MD)  (Signed on 18-May-14 10:09)  Authored  Last Updated: 18-May-14 10:09 by Scot JunElliott, Keisean Skowron T (MD)

## 2014-04-23 NOTE — Op Note (Signed)
PATIENT NAME:  Laura Barajas, Laura Barajas MR#:  [id number] DATE OF BIRTH:  October 21, 1955  DATE OF PROCEDURE:  06/01/2012  PREOPERATIVE DIAGNOSIS: Ischemic colitis.   POSTOPERATIVE DIAGNOSIS:  Ischemic colitis.  OPERATION: 1.  Completion, left colectomy.  2.  Drainage of pelvic abscess.  3.  Placement of central line.   SURGEON: Dr. Michela PitcherEly.  ASSISTANT: Dr. Excell Seltzerooper.   ANESTHESIA: General.   OPERATIVE PROCEDURE: With the patient in the supine position after induction of appropriate general anesthesia, a Foley catheter was placed in the ileostomy and the balloon inflated to provide for closure of the ileostomy. A Foley catheter had been placed. The abdomen was prepped with ChloraPrep and draped with sterile towels. An alcohol wipe, Betadine impregnated Steri-Drape were utilized. The previous midline incision was reopened. The incision was carried down through the subcutaneous tissue with Bovie electrocautery. Midline fascia was identified and the area of the previous closure was reopened. There were numerous dense adhesions. Free peritoneal space was very difficult to identify. The pelvis was the most obvious plane to start, and the colon was dissected up toward the splenic flexure. On dissecting the sacral promontory, I entered a large pocket of foul-smelling purulence. It was cultured. There appeared to be a deep pelvic abscess. Small bowel was packed into the right side of the abdomen and the colon followed up along the left colon and splenic flexure. The previous incision was taken down, brought into the abdominal cavity. The transverse colon was separated from the stomach between clamps and tying with 3-0 Vicryl, the splenic flexure was mobilized with a  combination of blunt and sharp dissection. The colon was then completely mobilized into the abdominal cavity. The mesentery was taken down between clamps using 3-0 Vicryl ligatures. The bowel vascularity appeared to be normal. There were several areas of bowel  that appeared to be ischemic, with fat necrosis and evidence of pericolonic reaction. No clear-cut perforation was identified. The bowel was passed off the table. The area was copiously irrigated with warm saline solution. A drain was placed in the pelvis and brought out through a separate stab wound using a 19-French drain. There did not appear to be any problems with the new ileostomy. The omentum had been amputated with a large portion of the colon. A Seprafilm layer was placed over the bowel and the abdomen closed with #1 PDS in a loop fashion, closing from each end running, tying in the middle. The skin was clipped. A central line was then placed in the right groin using a single pass and a triple-lumen catheter secured sterilely. The ostomy was then bagged and the Foley removed. Sterile dressings were applied. The patient was returned to the recovery room, having tolerated the procedure well. Sponge, instrument and needle counts were correct x 2 in the operating room.     ____________________________ Quentin Orealph L. Ely III, MD rle:dm D: 06/01/2012 22:12:48 ET T: 06/02/2012 08:15:22 ET JOB#: [id number]  cc: Quentin Orealph L. Ely III, MD, <Dictator> Quentin OreALPH L ELY MD ELECTRONICALLY SIGNED 06/06/2012 8:22

## 2014-04-23 NOTE — Consult Note (Signed)
PATIENT NAME:  Laura GauzeBOUAVONG, Amayia MR#:  [id number] DATE OF BIRTH:  Jun 30, 1955  DATE OF CONSULTATION:  05/30/2012  REFERRING PHYSICIAN:  Marisue IvanKanhka Linthavong, MD CONSULTING GASTROENTEROLOGIST:  Ezzard StandingPaul Y. Bluford Kaufmannh, MD; Hardie ShackletonKaryn M. Miasha Emmons, PA-C  REASON FOR CONSULTATION: Rectal bleeding.   HISTORY OF PRESENT ILLNESS: This is a pleasant, 59 year old [place]-speaking female, for whom the use of an interpreter is necessary. We have used [employer] and the cell phone number was identified. She does have an impressive past medical history, including a history of rectal cancer. She initially presented for an ileostomy reversal of a diverting loop ileostomy. However, this was complicated with a postoperative right-sided colon perforation secondary to ischemia. As a result, she did undergo a right hemicolectomy with end ileostomy and mucous fistula. This all occurred just last week and following the surgery, she was having postoperative rectal bleeding. She underwent a colonoscopy by Dr. Barnetta ChapelMartin Skulskie on 05/26/2012, during which time there was no evidence of an active bleed. However, there was residual blood noted within the lumen. This was determined to be related to left-sided colon ischemia; however, the bleeding had resolved and she had stabilized. Therefore, she was discharged to home just yesterday, 05/29/2012. Since discharge, she has had an increased output in her ostomy bag as well as a significant amount of rectal bleeding. Upon initial presentation, her hemoglobin was greater than 9, and since admission this has dropped down to 7.1. When speaking with the patient today, she states that she has not noticed any further rectal bleeding. There is no evidence of blood in the ostomy bag. Two units of packed red blood cells have been ordered, but are not yet transfused. She is denying any abdominal pain, nausea, vomiting or hematemesis. There is no melena. There is no lightheadedness or dizziness. She has no other current  symptomatic concerns or complaints outside of the high ostomy output as well as the history of rectal bleeding. Dr. Michela PitcherEly did consult her from surgery, who recommended a complete colectomy. However, it does appear that a vascular evaluation will be done prior for consideration of an angiogram.   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   PAST SURGICAL HISTORY: Surgery on her bowels are as mentioned above. Initially an ileostomy with diverting loop ileostomy. Last week, she had a right hemicolectomy with end ileostomy and mucous fistula. Also a left-sided Port-A-Cath placement, tubal ligation and thyroidectomy.   HOME MEDICATIONS: Levothyroxine, omeprazole, simvastatin, magnesium oxide and iron sulfate.   ALLERGIES: PENICILLIN, SHELLFISH AND HYDROCODONE.   FAMILY HISTORY: Negative for GI malignancy or IBD.    SOCIAL HISTORY: [place] and speaks [place] and very little [place]. Denies tobacco or alcohol use.   REVIEW OF SYSTEMS: A 10-system review of systems was obtained, though was limited secondary to the use of an interpreter. Pertinent positives are mentioned above.   OBJECTIVE: VITAL SIGNS: Blood pressure 112/77. Heart rate is 100. Temperature is 98.3. Bedside pulse oximetry is 100%.  GENERAL: This is a pleasant, 59 year old [place]-speaking female resting quietly and comfortably in bed, alert, in no acute distress.  HEAD: Atraumatic, normocephalic.  CARDIAC: Regular rate and rhythm.  PULMONARY: Respirations are even and unlabored, clear to auscultation in bilateral anterior lung fields.  ABDOMEN: Soft. Evidence of her recent surgery is noted with a midline surgical incision closed by staples. Also, a right-sided ileostomy bag is  noted with liquid brown/green stool. This does appear to be  very watery. No evidence of blood in the ostomy bag.  PSYCHIATRIC: Appropriate mood and affect.  RECTAL: Deferred.   LABORATORY DATA: White blood cells 9.5, hemoglobin 7.1, down from 9.6, hematocrit 25.7, platelets 696. Sodium 125, potassium 4, BUN 11, creatinine 1.41, glucose 163, lactic acid 0.8, bilirubin 0.3, alkaline phosphatase 121, ALT 45, AST 60, albumin 2.4.   ASSESSMENT: 1.  History of rectal cancer with a diverting loop ileostomy. She went in for reversal of this and had a right colon perforation secondary to ischemia and therefore underwent a right hemicolectomy with end ileostomy and mucous fistula. Now has what appears to be left-sided colon ischemia with recurrent rectal bleeding.  2.  Rectal bleeding.  3.  Anemia secondary to acute gastrointestinal blood loss.   PLAN: I have discussed this patient's case in detail with Dr. Lutricia Feil, who is involved in the development of the patient's plan of care. At this time, we did review her recent colonoscopy on 05/26/2012 just a few days ago and there was no evidence of an active bleed, but there was residual blood within the lumen. Because this has been recurrent, we do appreciate surgical evaluation and agree that she would likely benefit from a complete colectomy. Because her hemoglobin has dropped significantly over the past 24 hours, we do agree with the blood transfusion. We recommend serial hemoglobins to monitor the trend and being prepared to transfuse as necessary. Further recommendations as outlined by the surgical and vascular team. This was discussed with the patient and all questions were answered to the best of our ability using a translator.   Thank you so much for this consultation and for allowing [place] to participate in the patient's plan of care.   ____________________________ Hardie Shackleton. Jakarius Flamenco, PA-C kme:jm D: 05/30/2012 14:11:03 ET T: 05/30/2012 15:06:35 ET JOB#: [id number]  cc: Hardie Shackleton. Marrian Bells, PA-C, <Dictator> Hardie Shackleton Olimpia Tinch  PA ELECTRONICALLY SIGNED 06/02/2012 15:17

## 2014-04-23 NOTE — Consult Note (Signed)
PATIENT NAME:  Laura Barajas, Laura Barajas MR#:  [id number] DATE OF BIRTH:  07/02/1955  DATE OF CONSULTATION:  05/03/2012  REFERRING PHYSICIAN:  Christopher A. Lundquist, M.D.  CONSULTING PHYSICIAN:  Robert G. Gittin, MD  The patient is in the CCU 4.   The patient is a 59-year-old with a history of rectal cancer status post chemotherapy and radiation started in 01/2011 and then surgical treatment and anterior resection in 05/2011, and postoperative FOLFOX chemotherapy from 06/2011 to 11/2011. The patient has an addition past history of diabetes, high cholesterol, hypertension and reflux.   The patient was recently admitted for takedown of a diverting loop colostomy. This was performed on April 30th. On May 2nd, the patient had weakness and a syncopal episode after getting out of bed, proximal to having been given Phenergan, blood pressure was down to 40 systolic. There was also a temperature of 102 noted that afternoon. The patient was brought to the ICU. She was given IV fluid bolus. That the evening and then early morning of May 3rd, 2 units of packed red blood cells were given. The hemoglobin preoperative was at 8.7, postoperative it was 7.7. It was a lab result where the hemoglobin was down to 6.5 on May 2nd. Subsequently, a patient a Foley catheter placed. There was adequate urine but the patient was able to void and subsequent to that, today, urine output has been about 30 mL/hour, blood pressure gradually improved so that this afternoon the systolic pressures are normal. Antibiotics planned with Rocephin. Perioperatively the patient had a dose of Mefoxin. Hematology is consulted because of the cancer history and because the hemoglobin and the white count appeared to drop in the postoperative days.   ALLERGIES:  PENICILLIN APPARENTLY CAUSED A RASH AND CHEST TIGHTNESS, ALSO ALLERGIES TO SHELLFISH.  MEDICAL HISTORY:  As noted above also includes microcytic anemia, migraines, back pain, tubal ligation,  thyroidectomy. The patient has a port in the right side. .   MEDICATIONS PRIOR TO ADMISSION:  Ultram 50, 3 times a day, simvastatin 20 daily, Synthroid, oral iron, Zofran and omeprazole had been chronic medications.    PHYSICAL EXAMINATION:  VITAL SIGNS:  Stable.  GENERAL:  The patient is alert and cooperative. There is slight pallor. No thrush.  LYMPHATICS:  No palpable lymph nodes in the neck, supraclavicular, submandibular, or axilla.  LUNGS:  Clear.  ABDOMEN:  Wound not evaluated and deferred to Surgery.  EXTREMITIES:  No extremity edema. No calf tenderness.  HEART:  Regular.  NEUROLOGIC:  Grossly nonfocal. Moving all extremities. Alert and cooperative. Family members present translating. She is not short of breath. No chest pain. Does have some abdominal post surgical discomfort.   LABORATORY, DIAGNOSTIC, AND RADIOLOGICAL DATA:  Back on March 19th a white count 4.6, neutrophils normal with some lymphopenia that seems to have followed  previous chemotherapy and radiation and is stable. The hemoglobin was 9.8 and the platelets are 254. Creatinine was 1.9 to 2.1, baseline. On April 22nd, the white count was 4.0, hemoglobin was 8.7, platelets 232 while the MCV is down 8.2 and has been declining slowly. On May 1st, the white count was 4.2, hemoglobin was 7.7, platelets were 205, and then on May 2nd, the white count was 2.5, hemoglobin 7.8 and then later in the day the white count was 1.4 with 28% polys and 28% bands. The hemoglobin was 6.5, with platelets of 152. Early this morning, the white count is 2.1, hemoglobin 10.9, and platelets 145. Chest x-ray taken on May 2nd in   the afternoon for fever reports tiny pleural fluids, possible atelectasis. No definite infiltrate. The radiologist could not exclude minimal infiltrate. The urine was fairly benign. Pro-Time recently is INR 1.4.   IMPRESSION AND PLAN:  From the medical/surgical point of view, postoperatively, had a spell of hypotension, there is  probably some infection as the temperature spiked and some contribution of anemia. His hemoglobin was at least as low as 7.7 with some pre-existing anemia, but the drop of hemoglobin is 6.5, might have been real, might have been spurious as a sample where the CBC may have been effected by dilution or specimen collection. In any event, the hemoglobin is up to 10.9 post transfusion. The abdomen is certainly benign. He has been evaluated by Surgery. They have no suspicion of bleeding, no indication to scan the patient, he is clinically stable with currently good urine output and stable vital signs. There was a temperature spike, antibiotics have been started. With respect to the underlying cancer, the patient completed adjuvant treatment. Currently no signs or suspicion of recurrent cancer. With respect to baseline labs, recently there is normal neutrophil count, hemoglobin is 8.7, MCV has been dropping. This certainly can be iron deficiency. The patient had the last colonoscopy 01/2011. There is also mild chronic anemia due to azotemia.   PLAN:  Would follow repeat CBC and chemistry. Antibiotics and fluid and postoperative management is deferred to Surgery. I do not think there is a new hematology issue. Should watch the counts, if the patient becomes febrile then likely a postoperative complication but we could also access the port and draw cultures from the port and broaden the coverage as the patient does have the port in place. Otherwise, after current issues are resolved, would look at checking iron studies and look at the timing of repeat a Gastrointestinal study, where a recent barium enema was unrevealing. Again nothing else acutely from Hematology/Oncology, neutrophil count was adequate. No indication for growth factors.     ____________________________ Robert G. Gittin, MD rgg:jm D: 05/03/2012 16:52:45 ET T: 05/03/2012 18:05:46 ET JOB#: [id number]  cc: Robert G. Gittin, MD, <Dictator> ROBERT G  GITTIN MD ELECTRONICALLY SIGNED 05/14/2012 20:35 

## 2014-04-23 NOTE — Consult Note (Signed)
CC: ischemic colitis, abcess formation, GI bleed.  Pt doing better, hgb stable at 10.6, plt 329, WBC 8.5, albumin 1.5, Ostomy bag with no blood, bowel sounds present, not distended, VSS afebrile.  Making progress.  Dr. Anda KraftMarterre felt improvement was indication not to repeat CT at this time.  May go to rehab next week if continues to improve.  Electronic Signatures: Scot JunElliott, Langford Carias T (MD)  (Signed on 07-Jun-14 11:09)  Authored  Last Updated: 07-Jun-14 11:09 by Scot JunElliott, Kemal Amores T (MD)

## 2014-04-23 NOTE — Consult Note (Signed)
CC: GI bleed due to ischemic colitis.  i will sign off, recontact me If I can be of further service.  Electronic Signatures: Scot JunElliott, Orlan Aversa T (MD)  (Signed on 09-Jun-14 19:00)  Authored  Last Updated: 09-Jun-14 19:00 by Scot JunElliott, Tametria Aho T (MD)

## 2014-04-23 NOTE — Consult Note (Signed)
History of Present Illness:  Reason for Consult Anemia Rectal bleeding Previous history of carcinoma of the rectum status post chemoradiation therapy Multiple surgery with anastomotic leak and total colectomy   HPI   patient known to me with history of rectal cancer status post radiation chemotherapy and resection. status post anastomotic leak and colitis (ischemic) has rectal bleeding off and on .  Also require blood transfusion.  I was asked to evaluate patient because of persistent and progressive anemia.admitted with rectal bleeding and  had   total colectomy.   PFSH:  Comments history of colon cancer in the family   Social History noncontributory   Comments does not smoke.  Does not drink.  Patient is  divorcee. family lives in [place]   Additional Past Medical and Surgical History diabetes controlled with oral medication..   Hypercholesteremia.   Hypertension.   Gastroesophageal reflux disease   Review of Systems:  General weakness  fatigue   Performance Status (ECOG) 2   HEENT no complaints   Lungs no complaints   GI nausea  Had total colectomy   GU no complaints   Musculoskeletal no complaints   Extremities no complaints   Skin no complaints   Neuro no complaints   Endocrine no complaints   Psych anxiety   NURSING NOTES: **Vital Signs.:   05-Jun-14 01:47   Vital Signs Type: Q 4hr   Temperature Temperature (F): 98.9   Celsius: 37.1   Pulse Pulse: 135   Respirations Respirations: 19   Systolic BP Systolic BP: 157   Diastolic BP (mmHg) Diastolic BP (mmHg): 98   Mean BP: 117   Pulse Ox % Pulse Ox %: 95   Pulse Ox Activity Level: At rest   Oxygen Delivery: Room Air/ 21 %   Physical Exam:  General Patient is alert oriented not in any acute distress lying in the bed   HEENT: normal   Lungs: clear   Cardiac: regular rate, rhythm   Abdomen: Multiple previous surgery and multiple once healing well   Skin: intact   Neuro: AAOx3      Hypercholesterolemia:    GERD - Esophageal Reflux:    Low Back Pain:    Microcystic anemia:    Migraine Headaches:    Diabetes Mellitus, Type II (NIDD):    Rectal Cancer:    Hypertension:    Port-A- Cath (Right Side):    Tubal Ligation 1988:    Thyroidectomy 1988:    Penicillin: Rash, Tachycardia, Chest Tightness  Shellfish: Itching, Rash, Hives, Chest Tightness  Hydrocodone: Unknown    magnesium oxide 400 mg (241.3 mg elemental magnesium) oral tablet: 1 tab(s) orally 2 times a day, Status: Active, Quantity: 0, Refills: None   simvastatin 20 mg oral tablet: 1 tab(s) orally once a day (at bedtime), Status: Active, Quantity: 0, Refills: None   levothyroxine 75 mcg (0.075 mg) oral tablet: 1 tab(s) orally once a day, Status: Active, Quantity: 0, Refills: None   ferrous sulfate 325 mg (65 mg elemental iron) oral tablet: 1 tab(s) orally once a day, Status: Active, Quantity: 0, Refills: None   omeprazole 20 mg oral delayed release capsule: 1 cap(s) orally once a day (in the morning), Status: Active, Quantity: 0, Refills: None  Assessment and Plan: Impression:   1.Carcinoma of rectum  stage IIIpost chemoradiation therapy resection and anastomotic leak and multiple previous surgery total colectomy recently done because of ischemic colitis ANemia  iron is low . Ferritin is high {Acute phase reactant)likely anemia is secondary to  GI bleedingof GI bleeding needs to be evaluated by upper endoscopy  and  or  bleeding scan  or capsule study if needs to is slightly highcount is lowfareheme  510 mg intravenously is within normal limit.  A PTT slightly elevated.  There is no history of bleeding diathesis  secondary to bone marrow problem is just like he has white count and platelet count is within normal limitor sideroblastic anemia or refractory anemia cannot be ruled out is bone marrow aspiration and biopsies done  Plan:   Follow hemoglobin.  CC Referral:  cc: [facility]    Electronic Signatures: Laddie Aquashoksi, Jarman Litton K (MD)  (Signed 05-Jun-14 16:50)  Authored: HISTORY OF PRESENT ILLNESS, PFSH, ROS, NURSING NOTES, PE, PAST MEDICAL HISTORY, ALLERGIES, HOME MEDICATIONS, ASSESSMENT AND PLAN, CC Referring Physician   Last Updated: 05-Jun-14 16:50 by Laddie Aquashoksi, Bernadette Gores K (MD)

## 2014-04-23 NOTE — Consult Note (Signed)
Limited history has been obtained  because of language barrier.likely secondary to chemoradiation as well as dilutionalpersist a bone marrow aspiration biopsy to rule outmyelodysplastic syndromesupportive therapy at present time Rectal cancer no evidence of recurrent diseasewill follow this patient along with primary care physician and surgeon will also check iron   and ibc   Electronic Signatures: Laddie Aquashoksi, Morning Halberg K (MD) (Signed on 05-May-14 18:06)  Authored   Last Updated: 05-May-14 18:08 by Laddie Aquashoksi, Kim Oki K (MD)

## 2014-04-23 NOTE — Consult Note (Signed)
PATIENT NAME:  Laura Barajas, Laura Barajas MR#:  [id number] DATE OF BIRTH:  09-07-1955  DATE OF CONSULTATION:    CONSULTING PHYSICIAN:  Scot Junobert T. Elliott, MD  HISTORY OF PRESENT ILLNESS: The patient is a 59 year old female who has had previous surgery for colon cancer and she had an ostomy, had reanastomosis, and this was done on April 30 for diverting loop ileostomy. She had unfortunate complications of anastomotic leak, ischemic perforation of the right colon. She had a right colon resection,  ileostomy and creation of a fistula. She began having bleeding from the mucous fistula and the rectum. I was asked to consult on her for this. The patient was  passing clots and fresh blood through her rectum and the mucous fistula. There was no bleeding coming from the ileostomy. Dr. Juliann PulseLundquist requested an urgent consultation for the bleeding. The patient had trouble with shortness of breath,  tachycardia and tachypnea on postop day 4 after ileostomy reversal with a history of rectal cancer. I had previously performed a colonoscopy and demonstrated where she had had anastomosis after her initial surgery.   She speaks virtually no [place]. The use of  interpreter was done and all communications came from her.  The patient has had previous chemoradiation and on the previous colonoscopy, there was no evidence of recurrence at the anastomosis.   ALLERGIES: PENICILLIN AND SHELLFISH.   PHYSICAL EXAMINATION: GENERAL: Middle-aged female in no acute distress, looking a little pale.  VITAL SIGNS: Temperature 97.6, pulse 103, respirations 20, blood pressure 98/66.  HEENT: Sclerae anicteric. Conjunctivae slightly pale. Tongue slightly pale.  HEAD: Atraumatic. Trachea is in the midline.  CHEST: Clear.  HEART: No murmurs or gallops I can hear.  ABDOMEN: Ileostomy on the right and a colonic ostomy on the left.   LABORATORY DATA: Glucose 163, BUN 5, creatinine 1.23, sodium 132, potassium 4.3, chloride 100, CO2 of 27, calcium  6.9, phosphorus 2.8, magnesium 1.3. Cholesterol 91. Protein 4, albumin 1.2, alkaline phosphatase 122, SGOT 12. White count 12.7, hemoglobin 10.4, platelet count 370; that was done at 4:43 this morning. After her procedure, white count was 11.4, hemoglobin 9, platelet count 362. PT 15.6, PTT  40.3. Urinalysis showed 2+ blood. Prealbumin not obtained yet.   ASSESSMENT AND PLAN: Dr. Juliann PulseLundquist talked to me and I recommended a prep using a Foley type device. This was placed in the opening of the colon on the left side and 800 mL was flushed through. A colonoscopy was then later performed from the anal area to the end of the colon where it attached to the abdominal wall. Please see the operative note. Dr. Juliann PulseLundquist was present for this procedure, which showed ulcerations of the proximal portion of this colon which corresponded probably to the distal descending colon.   ____________________________ Scot Junobert T. Elliott, MD rte:jm D: 05/16/2012 17:10:00 ET T: 05/16/2012 17:37:25 ET JOB#: [id number]  cc: Scot Junobert T. Elliott, MD, <Dictator> Scot JunOBERT T ELLIOTT MD ELECTRONICALLY SIGNED 05/24/2012 10:16

## 2014-04-23 NOTE — Consult Note (Signed)
CC Pt seen with family, she denies any rectal bleeding.  Some fluid drainage around ostomy site.  No vomiting, chills when she had abd pain last nite.  Will repeat CT with contrast scan tomorrow due to chills and recent tachycardia. and anemia.    Electronic Signatures: Scot JunElliott, Alayah Knouff T (MD)  (Signed on 06-Jun-14 17:11)  Authored  Last Updated: 06-Jun-14 17:11 by Scot JunElliott, Keondria Siever T (MD)

## 2014-04-23 NOTE — Consult Note (Signed)
PATIENT NAME:  Laura Barajas, Laura Barajas MR#:  [id number] DATE OF BIRTH:  1955-10-25  DATE OF CONSULTATION:  05/03/2012  REFERRING PHYSICIAN:  Cristal Deer A. Lundquist, M.D.  CONSULTING PHYSICIAN:  Knute Neu. Gittin, MD  The patient is in the CCU 4.   The patient is a 59 year old with a history of rectal cancer status post chemotherapy and radiation started in 01/2011 and then surgical treatment and anterior resection in 05/2011, and postoperative FOLFOX chemotherapy from 06/2011 to 11/2011. The patient has an addition past history of diabetes, high cholesterol, hypertension and reflux.   The patient was recently admitted for takedown of a diverting loop colostomy. This was performed on April 30th. On May 2nd, the patient had weakness and a syncopal episode after getting out of bed, proximal to having been given Phenergan, blood pressure was down to 40 systolic. There was also a temperature of 102 noted that afternoon. The patient was brought to the ICU. She was given IV fluid bolus. That the evening and then early morning of May 3rd, 2 units of packed red blood cells were given. The hemoglobin preoperative was at 8.7, postoperative it was 7.7. It was a lab result where the hemoglobin was down to 6.5 on May 2nd. Subsequently, a patient a Foley catheter placed. There was adequate urine but the patient was able to void and subsequent to that, today, urine output has been about 30 mL/hour, blood pressure gradually improved so that this afternoon the systolic pressures are normal. Antibiotics planned with Rocephin. Perioperatively the patient had a dose of Mefoxin. Hematology is consulted because of the cancer history and because the hemoglobin and the white count appeared to drop in the postoperative days.   ALLERGIES:  PENICILLIN APPARENTLY CAUSED A RASH AND CHEST TIGHTNESS, ALSO ALLERGIES TO SHELLFISH.  MEDICAL HISTORY:  As noted above also includes microcytic anemia, migraines, back pain, tubal ligation,  thyroidectomy. The patient has a port in the right side. Marland Kitchen   MEDICATIONS PRIOR TO ADMISSION:  Ultram 50, 3 times a day, simvastatin 20 daily, Synthroid, oral iron, Zofran and omeprazole had been chronic medications.    PHYSICAL EXAMINATION:  VITAL SIGNS:  Stable.  GENERAL:  The patient is alert and cooperative. There is slight pallor. No thrush.  LYMPHATICS:  No palpable lymph nodes in the neck, supraclavicular, submandibular, or axilla.  LUNGS:  Clear.  ABDOMEN:  Wound not evaluated and deferred to Surgery.  EXTREMITIES:  No extremity edema. No calf tenderness.  HEART:  Regular.  NEUROLOGIC:  Grossly nonfocal. Moving all extremities. Alert and cooperative. Family members present translating. She is not short of breath. No chest pain. Does have some abdominal post surgical discomfort.   LABORATORY, DIAGNOSTIC, AND RADIOLOGICAL DATA:  Back on March 19th a white count 4.6, neutrophils normal with some lymphopenia that seems to have followed  previous chemotherapy and radiation and is stable. The hemoglobin was 9.8 and the platelets are 254. Creatinine was 1.9 to 2.1, baseline. On April 22nd, the white count was 4.0, hemoglobin was 8.7, platelets 232 while the MCV is down 8.2 and has been declining slowly. On May 1st, the white count was 4.2, hemoglobin was 7.7, platelets were 205, and then on May 2nd, the white count was 2.5, hemoglobin 7.8 and then later in the day the white count was 1.4 with 28% polys and 28% bands. The hemoglobin was 6.5, with platelets of 152. Early this morning, the white count is 2.1, hemoglobin 10.9, and platelets 145. Chest x-ray taken on May 2nd in  the afternoon for fever reports tiny pleural fluids, possible atelectasis. No definite infiltrate. The radiologist could not exclude minimal infiltrate. The urine was fairly benign. Pro-Time recently is INR 1.4.   IMPRESSION AND PLAN:  From the medical/surgical point of view, postoperatively, had a spell of hypotension, there is  probably some infection as the temperature spiked and some contribution of anemia. His hemoglobin was at least as low as 7.7 with some pre-existing anemia, but the drop of hemoglobin is 6.5, might have been real, might have been spurious as a sample where the CBC may have been effected by dilution or specimen collection. In any event, the hemoglobin is up to 10.9 post transfusion. The abdomen is certainly benign. He has been evaluated by Surgery. They have no suspicion of bleeding, no indication to scan the patient, he is clinically stable with currently good urine output and stable vital signs. There was a temperature spike, antibiotics have been started. With respect to the underlying cancer, the patient completed adjuvant treatment. Currently no signs or suspicion of recurrent cancer. With respect to baseline labs, recently there is normal neutrophil count, hemoglobin is 8.7, MCV has been dropping. This certainly can be iron deficiency. The patient had the last colonoscopy 01/2011. There is also mild chronic anemia due to azotemia.   PLAN:  Would follow repeat CBC and chemistry. Antibiotics and fluid and postoperative management is deferred to Surgery. I do not think there is a new hematology issue. Should watch the counts, if the patient becomes febrile then likely a postoperative complication but we could also access the port and draw cultures from the port and broaden the coverage as the patient does have the port in place. Otherwise, after current issues are resolved, would look at checking iron studies and look at the timing of repeat a Gastrointestinal study, where a recent barium enema was unrevealing. Again nothing else acutely from Hematology/Oncology, neutrophil count was adequate. No indication for growth factors.     ____________________________ Knute Neuobert G. Lorre NickGittin, MD rgg:jm D: 05/03/2012 16:52:45 ET T: 05/03/2012 18:05:46 ET JOB#: [id number]  cc: Knute Neuobert G. Lorre NickGittin, MD, <Dictator> Marin RobertsOBERT G  GITTIN MD ELECTRONICALLY SIGNED 05/14/2012 20:35

## 2014-04-23 NOTE — Op Note (Signed)
PATIENT NAME:  Laura Barajas, Laura Barajas MR#:  [id number] DATE OF BIRTH:  February 01, 1955  DATE OF PROCEDURE:  05/11/2012  PREOPERATIVE DIAGNOSIS: Anastomotic leak.   POSTOPERATIVE DIAGNOSIS: Ischemic right colon with  perforation.   PROCEDURE:  1.  Exploratory laparotomy.  2.  Drainage of abdominal fluid.  3. Adhesiolysis.  4.  Right colon resection with mucous fistula and ileostomy.    SURGEON: Matalyn Nawaz E. Excell Seltzerooper, MD   ANESTHESIA: General with endotracheal tube.   INDICATIONS: This is a patient who is status post ileostomy closure for resection for rectal cancer, who has been in the hospital postoperatively and has presented with free air and fluid noted on CT scan with low-grade fevers. Preoperatively via interpreter, I discussed with the patient and the family the options of observation rationale for surgery, the risks of bleeding, infection, recurrence of symptoms and small bowel resection. This was reviewed for them. They understood and agreed to proceed.   FINDINGS: Approximately 1800 mL of clear fluid removed from the pelvis and right paracolic gutter. There was a relatively contained perforation of a somewhat chronic nature in the right colon hepatic flexure area, likely due to patchy ischemia.   DESCRIPTION OF PROCEDURE: The patient was induced to general anesthesia, given IV antibiotics. VTE prophylaxis was in place. She was prepped and draped in a sterile fashion. A midline incision was utilized to open and explore the abdominal cavity. On so doing, cloudy, straw-colored fluid exuded and approximately 1800 mL of fluid was aspirated from the abdomen. Exploration in the area of the ileostomy closure and anastomosis was performed and it seemed to be intact. There was no gross inflammatory change around this area unexpected in the postoperative period. Further exploration demonstrated what appeared to be an inflammatory mass in the right upper quadrant extending up towards the hepatic flexure.  Dissection of this was performed and a cavity was opened, showing a chronic perforation of the hepatic flexure with spillage of stool. This was aspirated and dissection was performed to identify the source of spill as the hepatic flexure.   Resection was performed by dividing the terminal ileum near the ileocolonic anastomosis with a GIA stapler. Another GIA stapler was fired across the transverse colon and the mesentery was taken down between clamps and tied with 0 silk ligatures. The specimen was passed off for examination after inspection on the back table; showed perforation with probable ischemic change as the likely source in patchy fashion.   Irrigation was performed to irrigate out all the stool contaminated fluid and once this was completed, further exploration was performed. There was a piece of omentum tethered deep in the pelvis, which was taken down with electrocautery to allow for running of the small bowel. There was a section of small bowel that appeared grossly inflamed, but not perforated, as it was likely cyst secondary to its adjacent position with respect to perforated colon and abscess cavity. Once assuring that there was no other cause for this patient's condition and irrigation had been performed and the sponge, lap and needle counts were correct, a site was chosen for an ileostomy somewhat cephalad to the previous ileostomy closure and a mucous fistula in left upper quadrant. These sites were created in a standard fashion and the terminal ileum was pulled up through the right abdominal wall and the transverse colon was brought out through the left upper quadrant abdominal wall.   With sponge, lap and needle being correct and hemostasis being adequate, a piece of Seprafilm was placed and then  the wound was closed with running #1 PDS. Subcutaneous tissues were irrigated and staples were placed.   The mucous fistula was matured utilizing 3-0 Vicryl after excising the staple line and the  ileostomy was performed in a standard fashion utilizing quadrant sutures of 3-0 Vicryl, everting the full thickness terminal ileum and then placing further sutures of 3-0 Vicryl to properly mature the ileostomy.   At this point, a piece of Xeroform gauze was placed over the mucous fistula and a piece of Xeroform gauze was placed into the small opening of the prior ileostomy closure wound and an appliance was placed on the ileostomy. Dressing was placed over the wounds and the patient was taken to the recovery room in stable condition to be admitted for continued care. Sponge, lap and needle count was correct.   ESTIMATED BLOOD LOSS: 100 mL   ____________________________ Adah Salvage. Excell Seltzer, MD rec:cc D: 05/11/2012 18:54:21 ET T: 05/12/2012 10:40:34 ET JOB#: [id number]  cc: Adah Salvage. Excell Seltzer, MD, <Dictator> Lattie Haw MD ELECTRONICALLY SIGNED 05/14/2012 14:44

## 2014-04-23 NOTE — Consult Note (Signed)
Chief Complaint:  Subjective/Chief Complaint seen for elevarted wbc of uncertain etiology.  Paiten with acute episode of bleeding at the mucus fistula site.  only mucus at the rectum.  minimal change of hgb.  abdominal pain actually better today, no mostly centered at the midline incision line.   VITAL SIGNS/ANCILLARY NOTES: **Vital Signs.:   25-May-14 13:38  Vital Signs Type Routine  Temperature Temperature (F) 98.8  Celsius 37.1  Temperature Source oral  Pulse Pulse 114  Respirations Respirations 18  Systolic BP Systolic BP 119  Diastolic BP (mmHg) Diastolic BP (mmHg) 76  Mean BP 90  Pulse Ox % Pulse Ox % 97  Pulse Ox Activity Level  At rest  Oxygen Delivery Room Air/ 21 %   Brief Assessment:  Cardiac Regular   Respiratory clear BS   Gastrointestinal details normal Soft  Bowel sounds normal  No gaurding  No rigidity  minimal distension, appropriate post operative discomfort with minimal discomfort in the left abdomen. .   Lab Results: Routine Micro:  24-May-14 14:34   Culture Comment NO GROWTH IN 8-12 HOURS  Result(s) reported on 25 May 2012 at 08:57AM.    15:21   Culture Comment NO GROWTH IN 8-12 HOURS  Routine Chem:  25-May-14 05:21   Glucose, Serum  202  BUN 9  Creatinine (comp) 1.16  Sodium, Serum  132  Potassium, Serum 4.2  Chloride, Serum 99  CO2, Serum 27  Calcium (Total), Serum  7.5  Anion Gap  6  Osmolality (calc) 269  eGFR (African American) >60  eGFR (Non-African American)  53 (eGFR values <60mL/min/1.73 m2 may be an indication of chronic kidney disease (CKD). Calculated eGFR is useful in patients with stable renal function. The eGFR calculation will not be reliable in acutely ill patients when serum creatinine is changing rapidly. It is not useful in  patients on dialysis. The eGFR calculation may not be applicable to patients at the low and high extremes of body sizes, pregnant women, and vegetarians.)  Routine Hem:  22-May-14 04:51    Hemoglobin (CBC)  8.5  Platelet Count (CBC)  652  23-May-14 08:09   Hemoglobin (CBC)  8.4  Platelet Count (CBC)  843  24-May-14 02:22   WBC (CBC)  20.2  Hemoglobin (CBC)  8.7  Platelet Count (CBC)  797    15:21   WBC (CBC)  17.5  Hemoglobin (CBC)  8.8  Platelet Count (CBC)  772  25-May-14 05:21   WBC (CBC)  16.5  RBC (CBC)  3.04  Hemoglobin (CBC)  8.3  Hematocrit (CBC)  24.6  Platelet Count (CBC)  814  MCV 81  MCH 27.4  MCHC 33.8  RDW  17.0  Neutrophil % 92.3  Lymphocyte % 2.3  Monocyte % 4.8  Eosinophil % 0.5  Basophil % 0.1  Neutrophil #  15.3  Lymphocyte #  0.4  Monocyte # 0.8  Eosinophil # 0.1  Basophil # 0.0 (Result(s) reported on 25 May 2012 at 06:17AM.)   Radiology Results: XRay:    24-May-14 11:47, Chest PA and Lateral  Chest PA and Lateral   REASON FOR EXAM:    leukocytosis  COMMENTS:       PROCEDURE: DXR - DXR CHEST PA (OR AP) AND LATERAL  - May 24 2012 11:47AM     RESULT: Comparison: 05/22/2012    Findings:  The heart and mediastinum are stable. Right portacatheter tip terminates   overthe right atrium. There is a small left pleural effusion, similar to     prior. Mild left basilar opacities are likely secondary to atelectasis.    IMPRESSION:   Small left pleural effusion is similar to prior. Mild left basilar   opacities are nonspecific, but likely secondary to atelectasis. Infection     is not excluded.      Dictation Site: 8        Verified By: ROBERT L. SUBER, M.D., MD   Assessment/Plan:  Assessment/Plan:  Assessment 1)  leukocytosis-improving 2) recurrent bleeding at mucus fistula site,  stopped.  stable small change of hgb.  of note heparin was restarted.  minimal left abdominal pain to palpation, Pain now mostly at midline incision , superior aspect.  3) H/O colon cancer with resection,  right colectomy for ischemic colitis, end ileostomy, left sided colonic mucus fistula, concern for possible need for completion colectomy of left  colon.   Plan 1) plans for repeat colonoscopy held due to patient eating.  Patient otherwise stable.  Patient on schedule for colonoscopy tomorrow am, orders placed. discussed with Dr Bird, anesthesia.   Electronic Signatures: Skulskie, Martin (MD)  (Signed 25-May-14 17:51)  Authored: Chief Complaint, VITAL SIGNS/ANCILLARY NOTES, Brief Assessment, Lab Results, Radiology Results, Assessment/Plan   Last Updated: 25-May-14 17:51 by Skulskie, Martin (MD) 

## 2014-04-23 NOTE — Consult Note (Signed)
Chief Complaint:  Subjective/Chief Complaint No apparant acute distress or acute complaint, cannot communicate easily...see imp and plan   VITAL SIGNS/ANCILLARY NOTES: **Vital Signs.:   04-May-14 14:28  Vital Signs Type Routine  Temperature Temperature (F) 98.1  Celsius 36.7  Temperature Source oral  Pulse Pulse 89  Respirations Respirations 18  Systolic BP Systolic BP 102  Diastolic BP (mmHg) Diastolic BP (mmHg) 79  Mean BP 91  Pulse Ox % Pulse Ox % 97  Pulse Ox Activity Level  At rest  Oxygen Delivery Room Air/ 21 %   Brief Assessment:  Cardiac Regular   Respiratory clear BS  postive use of accessory muscles  tachypnea    Gastrointestinal details normal Soft   Additional Physical Exam abdo minmal tender, no guarding or rebound, no signs distress   Lab Results:  Hepatic:  03-May-14 16:56   Bilirubin, Total 0.6  Alkaline Phosphatase 52  SGPT (ALT) 29  SGOT (AST) 33  Total Protein, Serum  5.5  Albumin, Serum  2.4  Routine Chem:  03-May-14 08:27   Glucose, Serum  117  BUN  20  Creatinine (comp)  1.81  Sodium, Serum  146  Potassium, Serum 4.1  Chloride, Serum  121  CO2, Serum  17  Calcium (Total), Serum  7.3  Osmolality (calc) 294  eGFR (African American)  36  eGFR (Non-African American)  31 (eGFR values <62m/min/1.73 m2 may be an indication of chronic kidney disease (CKD). Calculated eGFR is useful in patients with stable renal function. The eGFR calculation will not be reliable in acutely ill patients when serum creatinine is changing rapidly. It is not useful in  patients on dialysis. The eGFR calculation may not be applicable to patients at the low and high extremes of body sizes, pregnant women, and vegetarians.)  Anion Gap 8    16:56   Glucose, Serum 93  BUN  23  Creatinine (comp)  1.78  Sodium, Serum  146  Potassium, Serum 4.3  Chloride, Serum  121  CO2, Serum  17  Calcium (Total), Serum  7.6  Osmolality (calc) 294  eGFR (African American)   37  eGFR (Non-African American)  32 (eGFR values <630mmin/1.73 m2 may be an indication of chronic kidney disease (CKD). Calculated eGFR is useful in patients with stable renal function. The eGFR calculation will not be reliable in acutely ill patients when serum creatinine is changing rapidly. It is not useful in  patients on dialysis. The eGFR calculation may not be applicable to patients at the low and high extremes of body sizes, pregnant women, and vegetarians.)  Anion Gap 8  Cardiac:  03-May-14 22:41   Troponin I < 0.02 (0.00-0.05 0.05 ng/mL or less: NEGATIVE  Repeat testing in 3-6 hrs  if clinically indicated. >0.05 ng/mL: POTENTIAL  MYOCARDIAL INJURY. Repeat  testing in 3-6 hrs if  clinically indicated. NOTE: An increase or decrease  of 30% or more on serial  testing suggests a  clinically important change)  Routine Coag:  03-May-14 22:41   Prothrombin  17.3  INR 1.4 (INR reference interval applies to patients on anticoagulant therapy. A single INR therapeutic range for coumarins is not optimal for all indications; however, the suggested range for most indications is 2.0 - 3.0. Exceptions to the INR Reference Range may include: Prosthetic heart valves, acute myocardial infarction, prevention of myocardial infarction, and combinations of aspirin and anticoagulant. The need for a higher or lower target INR must be assessed individually. Reference: The Pharmacology and Management of the  Vitamin K  antagonists: the seventh ACCP Conference on Antithrombotic and Thrombolytic Therapy. QKMMN.8177 Sept:126 (3suppl): [id number]. A HCT value >55% may artifactually increase the PT.  In one study,  the increase was an average of 25%. Reference:  "Effect on Routine and Special Coagulation Testing Values of Citrate Anticoagulant Adjustment in Patients with High HCT Values." American Journal of Clinical Pathology 2006;126:400-405.)  Routine Hem:  03-May-14 08:27   WBC (CBC)  2.4   RBC (CBC) 4.04  Hemoglobin (CBC)  10.9  Hematocrit (CBC)  32.7  Platelet Count (CBC)  145  MCV 81  MCH 27.0  MCHC 33.5  RDW  15.0  Neutrophil % 90.6  Lymphocyte % 4.8  Monocyte % 3.9  Eosinophil % 0.5  Basophil % 0.2  Neutrophil # 2.2  Lymphocyte #  0.1  Monocyte #  0.1  Eosinophil # 0.0  Basophil # 0.0 (Result(s) reported on 03 May 2012 at 09:16AM.)    16:56   WBC (CBC)  3.0  RBC (CBC) 4.22  Hemoglobin (CBC)  11.4  Hematocrit (CBC)  34.3  Platelet Count (CBC) 152  MCV 81  MCH 27.0  MCHC 33.2  RDW  15.1  Neutrophil % 91.7  Lymphocyte % 5.9  Monocyte % 2.1  Eosinophil % 0.3  Basophil % 0.0  Neutrophil # 2.8  Lymphocyte #  0.2  Monocyte #  0.1  Eosinophil # 0.0  Basophil # 0.0 (Result(s) reported on 03 May 2012 at 05:46PM.)   Assessment/Plan:  Assessment/Plan:  Assessment see dictated note from 5/3.  also discussed yesterday with surgery. appears that hypotention was infection plus vagal, also after phenergan, no evidence sepsis, there is a uti . anemia was chronic plus post op plus dilutional, better after transfusion, no evidence of bleed, neutropenia was spurrious and dilutional, on repeat back to chronic, with some lymphopenia due to prior tx. Currently comfortable, minimal abdo tenderness, soft..has had bloating and diarrhea. Noted to be tachypneic, resp rate 36/min. Called her daughter to translate. language line was not available, complained of sob, no chest pain, no palpitations.  Lungs clear, 02 sat 99, bp stable   Plan Recheck cre and cbc, if stable nothing else from heme onc,  will discuss sob with surgery,   Electronic Signatures: Dallas Schimke (MD)  (Signed 04-May-14 15:40)  Authored: Chief Complaint, VITAL SIGNS/ANCILLARY NOTES, Brief Assessment, Lab Results, Assessment/Plan   Last Updated: 04-May-14 15:40 by Dallas Schimke (MD)

## 2014-04-23 NOTE — Consult Note (Signed)
CC: LGI bleed.  Bleeding less, hgb falling some still is now 8.1.  Pt eating.  Follow HGb.  VSS.  Electronic Signatures: Scot JunElliott, Caran Storck T (MD)  (Signed on 20-May-14 17:39)  Authored  Last Updated: 20-May-14 17:39 by Scot JunElliott, Kierria Feigenbaum T (MD)

## 2014-04-23 NOTE — Consult Note (Signed)
Chief Complaint:  Subjective/Chief Complaint seen for elevated wbc, bleeding at the mucus fustula.  tolerated prep for colonoscopy.  patient is feeling better, no n/v, less abdominalpain.   VITAL SIGNS/ANCILLARY NOTES: **Vital Signs.:   26-May-14 06:19  Vital Signs Type Routine  Temperature Temperature (F) 98.3  Celsius 36.8  Temperature Source oral  Pulse Pulse 118  Respirations Respirations 16  Systolic BP Systolic BP 176  Diastolic BP (mmHg) Diastolic BP (mmHg) 71  Mean BP 82  Pulse Ox % Pulse Ox % 98  Pulse Ox Activity Level  At rest  Oxygen Delivery Room Air/ 21 %   Brief Assessment:  Cardiac Regular   Respiratory clear BS   Gastrointestinal details normal Soft  Nondistended  Bowel sounds normal  No rebound tenderness  mild to moderte tenderness around the ileostomy, minimal left abdominal tenderness.   Lab Results: Hepatic:  26-May-14 03:23   Bilirubin, Total 0.2  Alkaline Phosphatase 59  SGPT (ALT)  9  SGOT (AST)  13  Total Protein, Serum  4.9  Albumin, Serum  1.6  Routine Micro:  24-May-14 15:21   Micro Text Report BLOOD CULTURE   COMMENT                   NO GROWTH IN 36 HOURS   ANTIBIOTIC                       Culture Comment NO GROWTH IN 36 HOURS  Routine Chem:  26-May-14 03:23   Glucose, Serum  181  BUN  6  Creatinine (comp) 1.17  Sodium, Serum  134  Potassium, Serum 4.5  Chloride, Serum 100  CO2, Serum 27  Calcium (Total), Serum  7.3  Osmolality (calc) 270  eGFR (African American) >60  eGFR (Non-African American)  53 (eGFR values <7m/min/1.73 m2 may be an indication of chronic kidney disease (CKD). Calculated eGFR is useful in patients with stable renal function. The eGFR calculation will not be reliable in acutely ill patients when serum creatinine is changing rapidly. It is not useful in  patients on dialysis. The eGFR calculation may not be applicable to patients at the low and high extremes of body sizes, pregnant women, and  vegetarians.)  Anion Gap 7  Magnesium, Serum  1.5 (1.8-2.4 THERAPEUTIC RANGE: 4-7 mg/dL TOXIC: > 10 mg/dL  -----------------------)  Result Comment LABS - This specimen was collected through an   - indwelling catheter or arterial line.  - A minimum of 564m of blood was wasted prior    - to collecting the sample.  Interpret  - results with caution.  Result(s) reported on 26 May 2012 at 03:30AM.  Routine Hem:  24-May-14 02:22   WBC (CBC)  20.2    15:21   WBC (CBC)  17.5  25-May-14 05:21   WBC (CBC)  16.5  26-May-14 03:23   WBC (CBC)  13.7  RBC (CBC)  2.67  Hemoglobin (CBC)  7.2  Hematocrit (CBC)  21.7  Platelet Count (CBC)  677  MCV 81  MCH 26.8  MCHC 32.9  RDW  17.1  Neutrophil % 93.1  Lymphocyte % 2.1  Monocyte % 3.8  Eosinophil % 0.7  Basophil % 0.3  Neutrophil #  12.8  Lymphocyte #  0.3  Monocyte # 0.5  Eosinophil # 0.1  Basophil # 0.0   Assessment/Plan:  Assessment/Plan:  Assessment 1) leukocytosis-improving 2) bleeding at the mucosectomy/left colonic remnant site-stable, mixture of old and new.  3) colon cancer,  s/p resection and revision   Plan 1) colonoscopy  today.  I have discussed the risks benefits and complications of proceedure to include not limited to bleeding infection perforation and sedation through medical interpreter and she wishes to proceed.  further recs to follow.   Electronic Signatures: Loistine Simas (MD)  (Signed 26-May-14 08:33)  Authored: Chief Complaint, VITAL SIGNS/ANCILLARY NOTES, Brief Assessment, Lab Results, Assessment/Plan   Last Updated: 26-May-14 08:33 by Loistine Simas (MD)

## 2014-04-23 NOTE — Op Note (Signed)
PATIENT NAME:  Gibbons, Kalinda MR#:  [id number] DATE OF BIRTH:  12/03/1955  DATE OF PROCEDURE:  05/11/2012  PREOPERATIVE DIAGNOSIS: Anastomotic leak.   POSTOPERATIVE DIAGNOSIS: Ischemic right colon with  perforation.   PROCEDURE:  1.  Exploratory laparotomy.  2.  Drainage of abdominal fluid.  3. Adhesiolysis.  4.  Right colon resection with mucous fistula and ileostomy.    SURGEON: Kennede Lusk E. Tai Skelly, MD   ANESTHESIA: General with endotracheal tube.   INDICATIONS: This is a patient who is status post ileostomy closure for resection for rectal cancer, who has been in the hospital postoperatively and has presented with free air and fluid noted on CT scan with low-grade fevers. Preoperatively via interpreter, I discussed with the patient and the family the options of observation rationale for surgery, the risks of bleeding, infection, recurrence of symptoms and small bowel resection. This was reviewed for them. They understood and agreed to proceed.   FINDINGS: Approximately 1800 mL of clear fluid removed from the pelvis and right paracolic gutter. There was a relatively contained perforation of a somewhat chronic nature in the right colon hepatic flexure area, likely due to patchy ischemia.   DESCRIPTION OF PROCEDURE: The patient was induced to general anesthesia, given IV antibiotics. VTE prophylaxis was in place. She was prepped and draped in a sterile fashion. A midline incision was utilized to open and explore the abdominal cavity. On so doing, cloudy, straw-colored fluid exuded and approximately 1800 mL of fluid was aspirated from the abdomen. Exploration in the area of the ileostomy closure and anastomosis was performed and it seemed to be intact. There was no gross inflammatory change around this area unexpected in the postoperative period. Further exploration demonstrated what appeared to be an inflammatory mass in the right upper quadrant extending up towards the hepatic flexure.  Dissection of this was performed and a cavity was opened, showing a chronic perforation of the hepatic flexure with spillage of stool. This was aspirated and dissection was performed to identify the source of spill as the hepatic flexure.   Resection was performed by dividing the terminal ileum near the ileocolonic anastomosis with a GIA stapler. Another GIA stapler was fired across the transverse colon and the mesentery was taken down between clamps and tied with 0 silk ligatures. The specimen was passed off for examination after inspection on the back table; showed perforation with probable ischemic change as the likely source in patchy fashion.   Irrigation was performed to irrigate out all the stool contaminated fluid and once this was completed, further exploration was performed. There was a piece of omentum tethered deep in the pelvis, which was taken down with electrocautery to allow for running of the small bowel. There was a section of small bowel that appeared grossly inflamed, but not perforated, as it was likely cyst secondary to its adjacent position with respect to perforated colon and abscess cavity. Once assuring that there was no other cause for this patient's condition and irrigation had been performed and the sponge, lap and needle counts were correct, a site was chosen for an ileostomy somewhat cephalad to the previous ileostomy closure and a mucous fistula in left upper quadrant. These sites were created in a standard fashion and the terminal ileum was pulled up through the right abdominal wall and the transverse colon was brought out through the left upper quadrant abdominal wall.   With sponge, lap and needle being correct and hemostasis being adequate, a piece of Seprafilm was placed and then   the wound was closed with running #1 PDS. Subcutaneous tissues were irrigated and staples were placed.   The mucous fistula was matured utilizing 3-0 Vicryl after excising the staple line and the  ileostomy was performed in a standard fashion utilizing quadrant sutures of 3-0 Vicryl, everting the full thickness terminal ileum and then placing further sutures of 3-0 Vicryl to properly mature the ileostomy.   At this point, a piece of Xeroform gauze was placed over the mucous fistula and a piece of Xeroform gauze was placed into the small opening of the prior ileostomy closure wound and an appliance was placed on the ileostomy. Dressing was placed over the wounds and the patient was taken to the recovery room in stable condition to be admitted for continued care. Sponge, lap and needle count was correct.   ESTIMATED BLOOD LOSS: 100 mL   ____________________________ Letricia Krinsky E. Mykell Rawl, MD rec:cc D: 05/11/2012 18:54:21 ET T: 05/12/2012 10:40:34 ET JOB#: [id number]  cc: Dereke Neumann E. Samamtha Tiegs, MD, <Dictator> Tobie Hellen E Glendoris Nodarse MD ELECTRONICALLY SIGNED 05/14/2012 14:44 

## 2014-04-23 NOTE — Consult Note (Signed)
PATIENT NAME:  Barajas, Laura MR#:  [id number] DATE OF BIRTH:  06/25/1955  DATE OF CONSULTATION:  05/04/2012  REFERRING PHYSICIAN:  Dr. Gittin CONSULTING PHYSICIAN:  Jeffrey D. Sparks, MD  REASON FRO CONSULTATION:  Shortness of breath and tachycardia.   HISTORY OF PRESENT ILLNESS: The patient is a 59-year-old Asian female with a history of rectal cancer, status post chemotherapy and surgical excision in May 2013. She is followed by Dr. Choksi. Dr. Linthavong is her family physician. Was admitted on April 30 for ileostomy takedown by Dr. Marterre and was again admitted postoperatively to the surgical service. While in the hospital postoperatively, the patient was noted to have some mild pancytopenia and Dr. Gittin was consulted. During Dr. Gittin's examination, the patient was complaining of shortness of breath with tachypnea. She was noted to be tachycardic. Medicine consultation was subsequently requested. The patient is complaining of "breathing fast." She states that she is tired. Denies actual chest pain. She speaks very little English and no interpreter is available. As a result, the history from the patient is limited.   PAST MEDICAL HISTORY: 1.  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  2.  Status post ileostomy takedown on 04/30/2012.  3.  Benign hypertension.  4.  Type 2 diabetes.  5.  Hyperlipidemia.  6.  Hypothyroidism.  7.  Gastroesophageal reflux disease.  8.  Chronic anemia.   MEDICATIONS: 1.  Zocor 20 mg p.o. daily.  2.  Synthroid 75 mcg p.o. daily.  3.  Omeprazole 20 mg p.o. b.i.d.  4.  Iron sulfate 325 mg p.o. daily.  5.  Zofran 4 mg p.o. q. 4 hours p.r.n. nausea and vomiting.   ALLERGIES: PENICILLIN AND SHELLFISH.   SOCIAL HISTORY: There is no apparent history of alcohol or tobacco abuse.   FAMILY HISTORY: Unknown due to the language barrier.   REVIEW OF SYSTEMS: Unable to obtain due to language barrier.   PHYSICAL  EXAMINATION: GENERAL: The patient is in no acute distress.  VITAL SIGNS: Vital signs are currently remarkable for a blood pressure of 120/81 with a heart rate of 100 and a respiratory rate of 32. She is afebrile. Sats are 96% on room air.  HEENT: Normocephalic, atraumatic. Pupils equally round and reactive to light and accommodation. Extraocular movements are intact. Sclerae  are anicteric. Conjunctivae are clear.  Oropharynx is clear.  NECK: Supple without JVD. No adenopathy or thyromegaly is noted.  LUNGS: Basilar rhonchi with no wheezes or rales. No dullness. Respiratory effort is normal.  CARDIAC: Regular rate and rhythm with normal S1 and S2. No significant rubs, murmurs or gallops. PMI is nondisplaced. Chest wall is nontender.  ABDOMEN: Soft but diffusely tender. No rebound or guarding. Bowel sounds are present. No hernias or bruits were noted.  EXTREMITIES: Without clubbing, cyanosis or edema. Pulses were 2+ bilaterally.  SKIN: Warm and dry without rash or lesions.  NEUROLOGIC: Cranial nerves II through XII grossly intact. Deep tendon reflexes were symmetric. Motor and sensory examination is nonfocal.  PSYCHIATRIC: Exam revealed a patient who was alert and oriented to person, place, and time. She is cooperative.   LABORATORY, DIAGNOSTIC AND RADIOLOGIC DATA:  Portable chest x-ray done approximately one hour ago revealed bibasilar atelectasis, but was otherwise unremarkable.   EKG revealed sinus rhythm with no acute ischemic changes. Her creatinine is 1.45 with a GFR of 41. Troponin 0.04. White count 2.6 with a hemoglobin of 10.4 and a platelet count of 141,000.   ASSESSMENT: 1.    Tachypnea.  2.  Shortness of breath.  3.  Tachycardia.  4.  Pancytopenia.  5.  Hypothyroidism.  6.  History of rectal cancer.  7.  Benign hypertension, stable.   PLAN: We will follow up on a D-dimer and proceed with V/Q scan testing tomorrow. We will follow serial cardiac enzymes and obtain an  echocardiogram. We will check a TSH and a magnesium level. We will give 2 grams of magnesium empirically now. Otherwise, continue same regimen.   Thank you for the consultation. We will continue to follow this patient with you while in the hospital. Please call if questions arise.   ____________________________ Jeffrey D. Sparks, MD jds:cc D: 05/04/2012 17:19:55 ET T: 05/04/2012 18:03:17 ET JOB#: [id number]  cc: Jeffrey D. Sparks, MD, <Dictator> Kanhka Linthavong, MD JEFFREY D SPARKS MD ELECTRONICALLY SIGNED 05/04/2012 21:24 

## 2014-04-23 NOTE — Consult Note (Signed)
CC; rectal bleeding.  Pt had surgery for reattachment of colon but complicated course with ischemic coitis and abcess formation.  Pt with bleeding from rectum as BRB.  Asked by surgeon to look into colon and see if a place can be treated to stop the bleeding.  Will schedule urgent colonoscopy.  [place] or no [place].  Will try to notifiy family.  Electronic Signatures: Scot JunElliott, Robert T (MD)  (Signed on 16-May-14 12:59)  Authored  Last Updated: 16-May-14 12:59 by Scot JunElliott, Robert T (MD)

## 2014-04-23 NOTE — Op Note (Signed)
PATIENT NAME:  Laura Barajas, Laura Barajas MR#:  [id number] DATE OF BIRTH:  31-Oct-1955  DATE OF PROCEDURE:  05/15/2012  PREOPERATIVE DIAGNOSES:  1.  Colon cancer.  2.  Possible infection/abscess/fistula status post surgical resection.  3.  Poor venous access with need for continuous IV therapy, including hyperalimentation.   POSTOPERATIVE DIAGNOSES: 1.  Colon cancer.  2.  Possible infection/abscess/fistula status post surgical resection.  3.  Poor venous access with need for continuous IV therapy, including hyperalimentation.   PROCEDURES:  1. Ultrasound guidance for vascular access to right brachial vein.  2. Fluoroscopic guidance for placement of catheter.  3. Insertion of peripherally inserted central venous catheter, double-lumen, right arm.  SURGEON:  Annice NeedyJason S. Dew, MD  ANESTHESIA:  Local.   ESTIMATED BLOOD LOSS:  Minimal.   INDICATION FOR PROCEDURE:  This is an individual who we are asked to place a PICC line for durable venous access for hyperalimentation and multiple other medication. She apparently has a possible fistula and colon cancer and will need long-term intravenous therapy.   DESCRIPTION OF PROCEDURE: The patient's right arm was sterilely prepped and draped, and a sterile surgical field was created. The right brachial vein was accessed under direct ultrasound guidance without difficulty with a micropuncture needle and permanent image was recorded. 0.018 wire was then placed into the superior vena cava. Peel-away sheath was placed over the wire. A single lumen peripherally inserted central venous catheter was then placed over the wire and the wire and peel-away sheath were removed. The catheter tip was placed into the superior vena cava and was secured at the skin at 34 cm with a sterile dressing. The catheter withdrew blood well and flushed easily with heparinized saline. The patient tolerated procedure well.  ____________________________ Annice NeedyJason S. Dew, MD jsd:jm D: 05/15/2012  16:39:51 ET T: 05/15/2012 17:16:24 ET JOB#: [id number]  cc: Annice NeedyJason S. Dew, MD, <Dictator> Annice NeedyJASON S DEW MD ELECTRONICALLY SIGNED 05/19/2012 13:56

## 2014-04-23 NOTE — Op Note (Signed)
PATIENT NAME:  Laura Barajas, Laura Barajas MR#:  [id number] DATE OF BIRTH:  08/20/1955  DATE OF PROCEDURE:  05/15/2012  PREOPERATIVE DIAGNOSES:  1.  Colon cancer.  2.  Possible infection/abscess/fistula status post surgical resection.  3.  Poor venous access with need for continuous IV therapy, including hyperalimentation.   POSTOPERATIVE DIAGNOSES: 1.  Colon cancer.  2.  Possible infection/abscess/fistula status post surgical resection.  3.  Poor venous access with need for continuous IV therapy, including hyperalimentation.   PROCEDURES:  1. Ultrasound guidance for vascular access to right brachial vein.  2. Fluoroscopic guidance for placement of catheter.  3. Insertion of peripherally inserted central venous catheter, double-lumen, right arm.  SURGEON:  Keyla Milone S. Zamia Tyminski, MD  ANESTHESIA:  Local.   ESTIMATED BLOOD LOSS:  Minimal.   INDICATION FOR PROCEDURE:  This is an individual who we are asked to place a PICC line for durable venous access for hyperalimentation and multiple other medication. She apparently has a possible fistula and colon cancer and will need long-term intravenous therapy.   DESCRIPTION OF PROCEDURE: The patient's right arm was sterilely prepped and draped, and a sterile surgical field was created. The right brachial vein was accessed under direct ultrasound guidance without difficulty with a micropuncture needle and permanent image was recorded. 0.018 wire was then placed into the superior vena cava. Peel-away sheath was placed over the wire. A single lumen peripherally inserted central venous catheter was then placed over the wire and the wire and peel-away sheath were removed. The catheter tip was placed into the superior vena cava and was secured at the skin at 34 cm with a sterile dressing. The catheter withdrew blood well and flushed easily with heparinized saline. The patient tolerated procedure well.  ____________________________ Jovanie Verge S. Toron Bowring, MD jsd:jm D: 05/15/2012  16:39:51 ET T: 05/15/2012 17:16:24 ET JOB#: [id number]  cc: Kennley Schwandt S. Tristen Pennino, MD, <Dictator> Ege Muckey S Casmira Cramer MD ELECTRONICALLY SIGNED 05/19/2012 13:56 

## 2014-04-23 NOTE — Consult Note (Signed)
Chief Complaint:  Subjective/Chief Complaint seen for elevated wbc, bleeding from mucus fustula/rectum.  stable, feeling better, contijkues with pain at the ostomy and mucus filtula however improving daily.  no recurrent bleeding.   VITAL SIGNS/ANCILLARY NOTES: **Vital Signs.:   27-May-14 13:39  Vital Signs Type Routine  Temperature Temperature (F) 98.3  Celsius 36.8  Temperature Source oral  Pulse Pulse 104  Respirations Respirations 18  Systolic BP Systolic BP 135  Diastolic BP (mmHg) Diastolic BP (mmHg) 84  Mean BP 101  Pulse Ox % Pulse Ox % 99  Pulse Ox Activity Level  At rest  Oxygen Delivery Room Air/ 21 %  *Intake and Output.:   27-May-14 05:07  Other Output ml     Out:  300    Shift 07:00  Other Output ml     Out:  300    Daily 07:00  Other Output ml     Out:  1425    13:41  Other Output ml     Out:  450    Shift 15:00  Other Output ml     Out:  450   Brief Assessment:  Cardiac Regular   Respiratory clear BS   Gastrointestinal details normal Bowel sounds normal  No rebound tenderness  minimal distension, ileostomy pink/ healthy appearance, same for mucus fistula   Lab Results: Routine Hem:  01-May-14 05:04   WBC (CBC) 4.2  02-May-14 15:36   WBC (CBC)  2.5    22:41   WBC (CBC)  1.4  03-May-14 08:27   WBC (CBC)  2.4    16:56   WBC (CBC)  3.0  04-May-14 16:04   WBC (CBC)  2.6  05-May-14 01:49   WBC (CBC)  2.1  06-May-14 04:58   WBC (CBC) -  WBC (CBC) 5.2  07-May-14 04:27   WBC (CBC) 6.5  08-May-14 10:04   WBC (CBC) 6.8  09-May-14 04:40   WBC (CBC) 5.4  10-May-14 04:33   WBC (CBC) 7.5  11-May-14 04:43   WBC (CBC) 7.0    19:35   WBC (CBC) 3.8  12-May-14 04:37   WBC (CBC)  12.9  13-May-14 00:00   WBC (CBC)  16.1  14-May-14 04:38   WBC (CBC)  14.6  15-May-14 04:21   WBC (CBC)  12.1  16-May-14 04:43   WBC (CBC)  12.7 (Result(s) reported on 16 May 2012 at 05:18AM.)    15:13   WBC (CBC)  11.4  17-May-14 03:30   WBC (CBC) 9.1  (Result(s) reported on 17 May 2012 at 04:27AM.)  18-May-14 04:33   WBC (CBC) 10.1 (Result(s) reported on 18 May 2012 at 04:48AM.)  19-May-14 04:36   WBC (CBC) 10.9 (Result(s) reported on 19 May 2012 at 05:09AM.)    14:14   WBC (CBC)  14.5  20-May-14 03:50   WBC (CBC)  15.6  21-May-14 05:03   WBC (CBC)  17.5  22-May-14 04:51   WBC (CBC)  19.5  23-May-14 08:09   WBC (CBC)  17.5  24-May-14 02:22   WBC (CBC)  20.2    15:21   WBC (CBC)  17.5  25-May-14 05:21   WBC (CBC)  16.5  26-May-14 03:23   WBC (CBC)  13.7  27-May-14 05:02   WBC (CBC) 9.9  RBC (CBC)  2.89  Hemoglobin (CBC)  8.0  Hematocrit (CBC)  23.4  Platelet Count (CBC)  673  MCV 81  MCH 27.5  MCHC 34.0  RDW  16.7  Neutrophil % 90.7  Lymphocyte % 3.1  Monocyte % 5.1  Eosinophil % 0.6  Basophil % 0.5  Neutrophil #  9.0  Lymphocyte #  0.3  Monocyte # 0.5  Eosinophil # 0.1  Basophil # 0.0 (Result(s) reported on 27 May 2012 at 05:34AM.)   Assessment/Plan:  Assessment/Plan:  Assessment 1) s/p sigmoid resection for colon ca, right colectomy for ischemic colitis, end ileostomy, left sidedcolonic mucus fistula,  2) leukocytosis-improved 3) bleeding from mucus fistula/rectum-none over 24 hours.  concern for possible ischemic segment in the left colon-clinically improving.   Plan as above, will follow from a distance.   Electronic Signatures: Barnetta Chapel (MD)  (Signed 27-May-14 17:42)  Authored: Chief Complaint, VITAL SIGNS/ANCILLARY NOTES, Brief Assessment, Lab Results, Assessment/Plan   Last Updated: 27-May-14 17:42 by Barnetta Chapel (MD)

## 2014-04-23 NOTE — Consult Note (Signed)
Chief Complaint:  Subjective/Chief Complaint Asked to see aptient for GI followup by Dr Marina Gravel.  Paitent continuing with abdominal pain, variable, but main concern is increasing wbc with no aparent source.  Patient with extensive recent history of colon cancer, resection, rectal bleeding, revision of surgery with loop ileostomy, ischemic right colon and perforation requiring revision.  Now with end ileostomy and left colon mucous fistula.  Chart extensively reviewed.   currently with abdominal pain at surgery sites, likely appropriate, though c/o rectal and left abdominal pain when having a passage of mucus, this pain resolving after passage.  No nausea or vomiting. Tolerating po.   VITAL SIGNS/ANCILLARY NOTES: **Vital Signs.:   24-May-14 05:56  Vital Signs Type Routine  Temperature Temperature (F) 98.4  Celsius 36.8  Pulse Pulse 97  Respirations Respirations 17  Systolic BP Systolic BP 616  Diastolic BP (mmHg) Diastolic BP (mmHg) 84  Mean BP 98  Pulse Ox % Pulse Ox % 95  Pulse Ox Activity Level  At rest  Oxygen Delivery Room Air/ 21 %    13:32  Temperature Temperature (F) 98.3  Celsius 36.8  Pulse Pulse 99  Respirations Respirations 18  Systolic BP Systolic BP 837  Diastolic BP (mmHg) Diastolic BP (mmHg) 68  Mean BP 81  Pulse Ox % Pulse Ox % 97  Pulse Ox Activity Level  At rest  Oxygen Delivery Room Air/ 21 %   Brief Assessment:  Cardiac Regular   Respiratory clear BS   Gastrointestinal details normal Soft  No rebound tenderness  No gaurding  mild distension, mild diffuse discomfort, though morso noted around the ileostomy and  the left side of the abdomen aroung the mucus fistula site.   bowel sounds positive.   Lab Results: Routine Chem:  24-May-14 02:22   Glucose, Serum  225  BUN  20  Creatinine (comp)  1.37  Sodium, Serum  132  Potassium, Serum 4.4  Chloride, Serum 98  CO2, Serum 27  Calcium (Total), Serum  7.3  Anion Gap 7  Osmolality (calc) 274  eGFR (African  American)  51  eGFR (Non-African American)  44 (eGFR values <46m/min/1.73 m2 may be an indication of chronic kidney disease (CKD). Calculated eGFR is useful in patients with stable renal function. The eGFR calculation will not be reliable in acutely ill patients when serum creatinine is changing rapidly. It is not useful in  patients on dialysis. The eGFR calculation may not be applicable to patients at the low and high extremes of body sizes, pregnant women, and vegetarians.)  Result Comment LABS - This specimen was collected through an   - indwelling catheter or arterial line.  - A minimum of 568m of blood was wasted prior    - to collecting the sample.  Interpret  - results with caution.  Result(s) reported on 24 May 2012 at 02:55AM.  Magnesium, Serum  1.3 (1.8-2.4 THERAPEUTIC RANGE: 4-7 mg/dL TOXIC: > 10 mg/dL  -----------------------)  Routine UA:  24-May-14 12:12   Color (UA) Straw  Clarity (UA) Clear  Glucose (UA) Negative  Bilirubin (UA) Negative  Ketones (UA) Negative  Specific Gravity (UA) 1.003  Blood (UA) Negative  pH (UA) 6.0  Protein (UA) Negative  Nitrite (UA) Negative  Leukocyte Esterase (UA) Negative (Result(s) reported on 24 May 2012 at 12:32PM.)  RBC (UA) NONE SEEN  WBC (UA) <1 /HPF  Bacteria (UA) NONE SEEN  Epithelial Cells (UA) NONE SEEN  Mucous (UA) PRESENT (Result(s) reported on 24 May 2012 at 12:32PM.)  Routine Hem:  24-May-14 02:22   WBC (CBC)  20.2  RBC (CBC)  3.26  Hemoglobin (CBC)  8.7  Hematocrit (CBC)  26.5  Platelet Count (CBC)  797  MCV 81  MCH 26.8  MCHC 33.0  RDW  17.1  Neutrophil % 92.2  Lymphocyte % 2.1  Monocyte % 5.4  Eosinophil % 0.1  Basophil % 0.2  Neutrophil #  18.6  Lymphocyte #  0.4  Monocyte #  1.1  Eosinophil # 0.0  Basophil # 0.0 (Result(s) reported on 24 May 2012 at 02:44AM.)    15:21   WBC (CBC)  17.5  RBC (CBC)  3.25  Hemoglobin (CBC)  8.8  Hematocrit (CBC)  26.5  Platelet Count (CBC)  772  MCV 82  MCH  27.2  MCHC 33.3  RDW  16.9  Neutrophil % 92.2  Lymphocyte % 2.3  Monocyte % 5.0  Eosinophil % 0.3  Basophil % 0.2  Neutrophil #  16.1  Lymphocyte #  0.4  Monocyte # 0.9  Eosinophil # 0.0  Basophil # 0.0 (Result(s) reported on 24 May 2012 at 03:45PM.)   Radiology Results: XRay:    24-May-14 11:47, Chest PA and Lateral  Chest PA and Lateral   REASON FOR EXAM:    leukocytosis  COMMENTS:       PROCEDURE: DXR - DXR CHEST PA (OR AP) AND LATERAL  - May 24 2012 11:47AM     RESULT: Comparison: 05/22/2012    Findings:  The heart and mediastinum are stable. Right portacatheter tip terminates   overthe right atrium. There is a small left pleural effusion, similar to   prior. Mild left basilar opacities are likely secondary to atelectasis.    IMPRESSION:   Small left pleural effusion is similar to prior. Mild left basilar   opacities are nonspecific, but likely secondary to atelectasis. Infection     is not excluded.      Dictation Site: 8        Verified By: Gregor Hams, M.D., MD   Assessment/Plan:  Assessment/Plan:  Assessment 1) elevated wbc s/p multiple abdominal proceedures as noted above.  Agree with concern for possibility of  furhter problems of ischemia in the remnant left colon.  Review of colonoscopy reports indicate by phot and report evidence of large areas of mucosal sloughing/ulceration and old blood but no evidence of necrosis.   Patietn symptoms stable per prolonged discussion with patient through interpreter, except some symptoms of increased discomfort when passing mucousy effluent from the rectum.  This material has no fresh blood.   CBC rechecked, noted with some improvement of the wbc since this am, patietn afebrile, abdomen not acute.   Plan 1) will consider repeat colonoscopic evaluation.  patietn had eaten solids today.  As she seems otherwise stable, will arrange for sedated flex sig tomorrow, unsure as to whether anesthesia services will be needed as  the colonoscopy was done with MAC.  Following, discussed with Dr Marina Gravel.   Electronic Signatures: Loistine Simas (MD)  (Signed 24-May-14 16:14)  Authored: Chief Complaint, VITAL SIGNS/ANCILLARY NOTES, Brief Assessment, Lab Results, Radiology Results, Assessment/Plan   Last Updated: 24-May-14 16:14 by Loistine Simas (MD)

## 2014-04-23 NOTE — Op Note (Signed)
PATIENT NAME:  Laura GauzeBOUAVONG, Sera MR#:  [id number] DATE OF BIRTH:  1955-10-14  DATE OF PROCEDURE:  04/30/2012  PREOPERATIVE DIAGNOSIS: Rectal cancer with diverting loop ileostomy.   POSTOPERATIVE DIAGNOSIS: Rectal cancer with diverting loop ileostomy.   OPERATION PERFORMED: Enteroenterostomy with enterectomy (diverting loop ileostomy takedown).   SURGEON: Claude MangesWilliam F. Vernita Tague, MD  ANESTHESIA: General.   PROCEDURE IN DETAIL: The patient was placed supine on the operating room table and prepped and draped in the usual sterile fashion. A small elliptical incision was made oriented along a paramedian axis around the ileostomy, and this was carried down through the skin and subcutaneous tissue to the ileum. The ileum was dissected out of surrounding scar tissue in the subcutaneous space down to the anterior rectus sheath, and this was then dissected off of the ileum as was the rectus musculature. The inferior epigastric artery and vein were intact and were very close to the ileum, and this was successfully dissected off and spared throughout the procedure. Ultimately, the peritoneum was located and entered, and then the terminal ileum and more proximal ileal loop were delivered into the wound. The antimesenteric fat on the terminal ileum was excised, and a side-to-side enteroenterostomy was performed with GIA stapling device extracorporeally, and the resultant enterotomy and redundant mesentery was stapled with a TA-60 stapler. Mesenteric bleeders were controlled with 3-0 silk suture ligatures, and another 3-0 silk seromuscular Lembert suture was placed at the apex of the GIA staple line. The anastomosis was widely patent and hemostatic and was dropped back inside the peritoneal cavity and the omentum was brought to cover it. The ileostomy defect was below the area where there was a well-developed posterior rectus sheath, but the peritoneum was closed with a running 3-0 Monocryl suture. The anterior rectus  sheath was then closed with a running #1 PDS suture in a longitudinal (actually somewhat tangential) orientation. The subcutaneous tissue was irrigated with copious amounts of warm normal saline, and the skin was reapproximated with a skin stapling device, leaving a gap in the middle through which a single saline-soaked Telfa wick was placed. A sterile dressing was applied. The patient tolerated the procedure well. There were no complications.  ____________________________ Claude MangesWilliam F. Yana Schorr, MD wfm:OSi D: 04/30/2012 12:50:13 ET T: 04/30/2012 13:19:29 ET JOB#: [id number]  cc: Claude MangesWilliam F. Zilpha Mcandrew, MD, <Dictator> Claude MangesWILLIAM F Bernadetta Roell MD ELECTRONICALLY SIGNED 05/04/2012 14:45

## 2014-04-23 NOTE — Consult Note (Signed)
Chief Complaint:  Subjective/Chief Complaint Asked to see pt for SOB and tachycardia with tachypnea. Pt denies CP. Post-op day #4 from ileostomy reversal with hx of rectal CA.   VITAL SIGNS/ANCILLARY NOTES: **Vital Signs.:   04-May-14 05:15  Temperature Temperature (F) 98  Pulse Pulse 115  Respirations Respirations 16  Systolic BP Systolic BP 323  Diastolic BP (mmHg) Diastolic BP (mmHg) 73  Pulse Ox % Pulse Ox % 94  Oxygen Delivery Room Air/ 21 %    14:28  Temperature Temperature (F) 98.1  Pulse Pulse 89  Respirations Respirations 18  Systolic BP Systolic BP 557  Diastolic BP (mmHg) Diastolic BP (mmHg) 79  Pulse Ox % Pulse Ox % 97  Oxygen Delivery Room Air/ 21 %    15:30  Pulse Pulse 100  Respirations Respirations 32  Systolic BP Systolic BP 322  Diastolic BP (mmHg) Diastolic BP (mmHg) 81  Pulse Ox % Pulse Ox % 96  Oxygen Delivery Room Air/ 21 %   Brief Assessment:  Cardiac Regular  no murmur   Respiratory normal resp effort  rhonchi   Gastrointestinal details normal Soft  Bowel sounds normal  No rigidity   Gastrointestinal details abnormal Minimum Tenderness   Tenderness Difuse   Lab Results: Routine Chem:  01-May-14 05:04   Creatinine (comp)  1.97  eGFR (Non-African American)  28 (eGFR values <43m/min/1.73 m2 may be an indication of chronic kidney disease (CKD). Calculated eGFR is useful in patients with stable renal function. The eGFR calculation will not be reliable in acutely ill patients when serum creatinine is changing rapidly. It is not useful in  patients on dialysis. The eGFR calculation may not be applicable to patients at the low and high extremes of body sizes, pregnant women, and vegetarians.)  Glucose, Serum  103  BUN  24  Sodium, Serum 143  Potassium, Serum 3.9  Chloride, Serum  115  CO2, Serum 21  02-May-14 22:41   Creatinine (comp)  1.80  eGFR (Non-African American)  31 (eGFR values <64mmin/1.73 m2 may be an indication of  chronic kidney disease (CKD). Calculated eGFR is useful in patients with stable renal function. The eGFR calculation will not be reliable in acutely ill patients when serum creatinine is changing rapidly. It is not useful in  patients on dialysis. The eGFR calculation may not be applicable to patients at the low and high extremes of body sizes, pregnant women, and vegetarians.)  Glucose, Serum  128  BUN 15  Sodium, Serum  146  Potassium, Serum  3.0  Chloride, Serum  120  CO2, Serum  18  03-May-14 08:27   Creatinine (comp)  1.81  eGFR (Non-African American)  31 (eGFR values <6076min/1.73 m2 may be an indication of chronic kidney disease (CKD). Calculated eGFR is useful in patients with stable renal function. The eGFR calculation will not be reliable in acutely ill patients when serum creatinine is changing rapidly. It is not useful in  patients on dialysis. The eGFR calculation may not be applicable to patients at the low and high extremes of body sizes, pregnant women, and vegetarians.)  Glucose, Serum  117  BUN  20  Sodium, Serum  146  Potassium, Serum 4.1  Chloride, Serum  121  CO2, Serum  17    16:56   Creatinine (comp)  1.78  eGFR (Non-African American)  32 (eGFR values <8m18mn/1.73 m2 may be an indication of chronic kidney disease (CKD). Calculated eGFR is useful in patients with stable renal function. The eGFR calculation  will not be reliable in acutely ill patients when serum creatinine is changing rapidly. It is not useful in  patients on dialysis. The eGFR calculation may not be applicable to patients at the low and high extremes of body sizes, pregnant women, and vegetarians.)  Glucose, Serum 93  BUN  23  Sodium, Serum  146  Potassium, Serum 4.3  Chloride, Serum  121  CO2, Serum  17  04-May-14 15:23   Creatinine (comp)  1.45  eGFR (Non-African American)  41 (eGFR values <60mL/min/1.73 m2 may be an indication of chronic kidney disease (CKD). Calculated  eGFR is useful in patients with stable renal function. The eGFR calculation will not be reliable in acutely ill patients when serum creatinine is changing rapidly. It is not useful in  patients on dialysis. The eGFR calculation may not be applicable to patients at the low and high extremes of body sizes, pregnant women, and vegetarians.)  Cardiac:  03-May-14 22:41   Troponin I < 0.02 (0.00-0.05 0.05 ng/mL or less: NEGATIVE  Repeat testing in 3-6 hrs  if clinically indicated. >0.05 ng/mL: POTENTIAL  MYOCARDIAL INJURY. Repeat  testing in 3-6 hrs if  clinically indicated. NOTE: An increase or decrease  of 30% or more on serial  testing suggests a  clinically important change)  04-May-14 15:23   Troponin I 0.04 (0.00-0.05 0.05 ng/mL or less: NEGATIVE  Repeat testing in 3-6 hrs  if clinically indicated. >0.05 ng/mL: POTENTIAL  MYOCARDIAL INJURY. Repeat  testing in 3-6 hrs if  clinically indicated. NOTE: An increase or decrease  of 30% or more on serial  testing suggests a  clinically important change)  Routine Hem:  01-May-14 05:04   WBC (CBC) 4.2  Hemoglobin (CBC)  7.7  Platelet Count (CBC) 205  02-May-14 15:36   WBC (CBC)  2.5  Hemoglobin (CBC)  7.8  Platelet Count (CBC) 171    22:41   WBC (CBC)  1.4  Hemoglobin (CBC)  6.5  Platelet Count (CBC) 152  03-May-14 08:27   WBC (CBC)  2.4  Hemoglobin (CBC)  10.9  Platelet Count (CBC)  145    16:56   WBC (CBC)  3.0  Hemoglobin (CBC)  11.4  Platelet Count (CBC) 152  04-May-14 16:04   WBC (CBC)  2.6  Hemoglobin (CBC)  10.4  Platelet Count (CBC)  141 (Result(s) reported on 04 May 2012 at 05:05PM.)   Radiology Results: XRay:    04-May-14 16:40, Chest Portable Single View  Chest Portable Single View   REASON FOR EXAM:    sob  COMMENTS:       PROCEDURE: DXR - DXR PORTABLE CHEST SINGLE VIEW  - May 04 2012  4:40PM     RESULT: The lungs are hypoinflated. There is basilar atelectasis   bilaterally but most  conspicuously on the left. A Port-A-Cath appliance   is in place. The cardiac silhouette is normal in size. The pulmonary   vascularity is not engorged.    IMPRESSION:  The study is limited due to hypoinflation. Bibasilar   atelectasis is suspected.     Dictation Site: 5    Verified By: DAVID A. JORDAN, M.D., MD   Assessment/Plan:  Assessment/Plan:  Assessment Will f/u on d-dimer. EKG neg. Follow enzymes. Check echo, VQ scan, TSH, and magnesium. Will follow with you.   Electronic Signatures: Sparks, Jeffrey D (MD)  (Signed 04-May-14 17:13)  Authored: Chief Complaint, VITAL SIGNS/ANCILLARY NOTES, Brief Assessment, Lab Results, Radiology Results, Assessment/Plan   Last Updated: 04-May-14   17:13 by Sparks, Jeffrey D (MD) 

## 2014-04-23 NOTE — Consult Note (Signed)
Asked by Dr. Egbert GaribaldiBird to see patient about vascular options for her problems.  She had a LAR done for rectal CA with a loop ileostomy.  Subsequently she developed right colonic ischemia and had an end ileostomy placed.  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  This has had bleeding requiring 3 u PRBC.  Scope showed ischemia of her remaining colon.  Reviewed her original CT scan from a couple of months ago and her celiac, SMA, and IMA are all patent and appear normal.  Patient's daughter has left and the interpretor has left for the day, and she speaks no [place].  Will come back tomorrow or would be happy to discuss with daughter on the phone when she returns.  I do not believe there would be a role for emoblization for her bleeding with ischemia already present in her remaining colon.  With her Celiac, SMA, and IMA patent, there are really no vascular options available to improve her colonic perfusion.  difficult situation, and may ultimately require a completion colectomy and permanent ostomy.  Electronic Signatures: Annice Needyew, Gumecindo Hopkin S (MD)  (Signed on 19-May-14 15:13)  Authored  Last Updated: 19-May-14 15:13 by Annice Needyew, Micky Sheller S (MD)

## 2014-04-23 NOTE — Discharge Summary (Signed)
PATIENT NAME:  Laura Barajas, Laura Barajas MR#:  [id number] DATE OF BIRTH:  [birth year]  DATE OF ADMISSION:  05/30/2012  DATE OF DISCHARGE:  06/11/2012  BRIEF HISTORY: [name] is a 59 year old woman who had been discharged from the hospital on May 29 following a low anterior resection complicated by a colonic perforation of the right colon. She had persistent lower GI bleeding, which was felt to be secondary to ischemic colitis in the splenic flexure and proximal descending colon. This diagnosis had been confirmed initially by colonoscopy. She was discharged home, as she had stopped bleeding on the 29th, and then had another episode of bleeding on the early morning of the 30th, and was admitted to the hospital. She was placed back on IV rehydration. It was apparent at this time that she would not be able to be discharged without further operative intervention. We waited for the [place] interpreter to assist us in discussing the operative procedure with her, and after appropriate preoperative preparation and informed consent, she was taken to surgery on the evening of June 1, where she underwent a completion colectomy. She had a previous ileostomy performed, which was functioning normally. She had some mild rectal bleeding from the devascularized stump, but that resolved over the first couple of hospital days. She rapidly improved. Hemoglobin came back to normal. She did not have any evidence of any further bleeding. She was discharged home on the 11th, to be followed in the office in 7 to 10 days' time. Bathing, activity, and driving instructions were given to the patient. Colostomy care was also discussed with the patient, and Home Health will be following her.   DISCHARGE MEDICATIONS INCLUDE: Simvastatin 20 mg p.o. at bedtime, levothyroxine 0.075 mg once a day, iron sulfate 325 once a day, omeprazole 20 mg once a day, magnesium oxide 40 mg twice a day, Percocet 325/5 mg every 6 hours p.r.n., ciprofloxacin 500  mg every 12 hours for 10 days.   DISCHARGE DIAGNOSIS:  Ischemic colitis.  SURGERY:  Completion colectomy.    ____________________________ Quentin Orealph L. Ely III, MD rle:mr D: 06/20/2012 21:32:04 ET T: 06/20/2012 22:36:07 ET JOB#: [id number]  cc: Carmie Endalph L. Ely III, MD, <Dictator> Marisue IvanKanhka Linthavong, MD Ezzard StandingPaul Y. Bluford Kaufmannh, MD   Quentin OreALPH L ELY MD ELECTRONICALLY SIGNED 06/22/2012 20:08

## 2014-04-23 NOTE — Discharge Summary (Signed)
PATIENT NAME:  Laura Barajas, Laura Barajas MR#:  [id number] DATE OF BIRTH:  12/20/1955  DATE OF ADMISSION:  04/30/2012 DATE OF DISCHARGE:  05/29/2012  PRINCIPAL DIAGNOSIS: Rectal cancer, status post low anterior resection and diverting loop ileostomy.   PRINCIPAL PROCEDURE PERFORMED DURING THIS ADMISSION: On 04/30/2012, enteroenterostomy with enterectomy (diverting loop ileostomy takedown).   OTHER PROCEDURES PERFORMED DURING THIS ADMISSION: On May 11th, exploratory laparotomy, drainage of abdominal fluid, adhesiolysis and right colectomy with mucous fistula and ileostomy for perforated ischemic colitis.   HOSPITAL COURSE: [name] underwent the above-mentioned procedure for the above-mentioned diagnosis, and she developed hypotension and anemia and leukopenia and never progressed as expected postoperatively and ultimately underwent the second noted procedure on May 11th. On May 16th, she underwent colonoscopy for rectal bleeding and was found to have multiple ulcers in her distal descending colon and proximal sigmoid colon. This also was thought to be due to ischemic colitis from watershed area. She ultimately improved to the point of discharge on May 29th.   She was sent home on simvastatin 20 mg at bedtime, levothyroxine 75 mcg daily, iron sulfate, omeprazole 20 mg q.a.m., acetaminophen/hydrocodone 325/5 q.4h p.r.n. and magnesium oxide 400 mg b.i.d.   She was asked to make an appointment to see me in 1 to 2 weeks and to call the office for any problems.   ____________________________ Claude MangesWilliam F. Hailyn Zarr, MD wfm:gb D: 06/08/2012 18:16:38 ET T: 06/09/2012 00:26:24 ET JOB#: [id number]  cc: Claude MangesWilliam F. Britton Perkinson, MD, <Dictator> Claude MangesWILLIAM F Tyan Dy MD ELECTRONICALLY SIGNED 06/13/2012 21:15

## 2014-04-23 NOTE — Consult Note (Signed)
Pt seen and examined. Relative friend available to interpret at bedside. Please see Wilhelmenia BlaseKaryn Earle's notes. Complicated hospital hx. Recurrent rectal bleeding. Scoped by both Drs. Mechele CollinElliott and [employer]. Bleeding either at surgical site or diverticulum. No obvious ischemia seen last time patient had colonoscopy last week. Agree with Dr. Michela PitcherEly that patient will require total colectomy at this time due to recurrent bleeding. Not a whole lot GI can offer to patient now. Call us back if treatment plan changes. THanks.  Electronic Signatures: Lutricia Feilh, Elis Sauber (MD)  (Signed on 30-May-14 13:53)  Authored  Last Updated: 30-May-14 13:53 by Lutricia Feilh, Kirsi Hugh (MD)

## 2014-04-23 NOTE — Consult Note (Signed)
CC: ischemic colitis.  Pt reports no rectal bleeding today. Small amt on JP drain.  No blood in ostomy bag.  If she had UGI bleeding or small bowel bleeding it would show up in her ostomy bag.  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  Since no bleeding reported today I am loathe to put a scope into her closed off rectum unless there is serious bleeding coming out the rectum.  Discussed with Dr. Michela PitcherEly who did her surgery a few days ago.  Consult passed to me since I scoped her recently.  I will follow over next few days.  Electronic Signatures: Scot JunElliott, Cody Albus T (MD)  (Signed on 05-Jun-14 18:35)  Authored  Last Updated: 05-Jun-14 18:35 by Scot JunElliott, Melyssa Signor T (MD)

## 2014-04-23 NOTE — Consult Note (Signed)
CC: GI bleeding.  Some clots passed during nite, no fresh blood.  These likely are old blood and clots seen at time of colon exam.  Agree with plans to transfuse, especially given her ischemic colitis.  Chest clear, abd not distended, bile liquid in ostomy bag.  VSS afeb.  Electronic Signatures: Scot JunElliott, Justa Hatchell T (MD)  (Signed on 17-May-14 09:10)  Authored  Last Updated: 17-May-14 09:10 by Scot JunElliott, Lelani Garnett T (MD)

## 2014-04-23 NOTE — Consult Note (Signed)
CC GI bleeding.  Pt had bleeding from rectum and ostomy site where the left colon is open on the left abdomen.  Colonoscopy done showing ischemic colitis in the distal descending colon, maybe proximal sigmoid,  Dr. Juliann PulseLundquist was present.  No active bleeding at this moment, lots of clots seen.   See op note and pictures.  Plan per Dr, Juliann PulseLundquist.  Electronic Signatures: Scot JunElliott, Robert T (MD)  (Signed on 16-May-14 14:53)  Authored  Last Updated: 16-May-14 14:53 by Scot JunElliott, Robert T (MD)

## 2014-04-25 NOTE — Consult Note (Signed)
History of Present Illness:   Reason for Consult Rectal cancer on chemotherapy, admitted for intractable nausea and vomiting, anemia and neutropenia.    HPI   Patient was last seen in clinic on June 26, 2011 where she received her 1st infusion of adjuvant FOLFOX chemotherapy for rectal cancer.  Patient's entire visit was done over the phone via interpreter.  The past several days it became increasingly nauseous and was unable to tolerate PO intake.  She denies any fevers.  His increased weakness and fatigue.  She has no chest pain or shortness of breath.  She denies any diarrhea or constipation.  She has no urinary complaints.  Patient offers no further complaints.  PFSH:   Additional Past Medical and Surgical History Past medical history: Diabetes, hypercholesterolemia, hypertension, GERD.  Past surgical history: Partial colectomy, thyroidectomy, tubal ligation.  Family history: Colon cancer.  Social history: Patient denies tobacco or alcohol.   Review of Systems:   Performance Status (ECOG) 0    Review of Systems   As per HPI. Otherwise, 10 point system review was negative.   NURSING NOTES: **Vital Signs.:   05-Jul-13 15:40    Vital Signs Type: Blood Transfusion Complete    Temperature Temperature (F): 98    Celsius: 36.6    Temperature Source: oral    Pulse Pulse: 87    Respirations Respirations: 16    Systolic BP Systolic BP: 120    Diastolic BP (mmHg) Diastolic BP (mmHg): 78    Mean BP: 92    Pulse Ox % Pulse Ox %: 100    Oxygen Delivery: Room Air/ 21 %   Physical Exam:   Physical Exam General: Thin, no acute distress. Eyes: Pink conjunctiva, anicteric sclera. HEENT: Normocephalic, moist mucous membranes, clear oropharnyx. Lungs: Clear to auscultation bilaterally. Heart: Regular rate and rhythm. No rubs, murmurs, or gallops. Abdomen: Soft, nontender, nondistended. No organomegaly noted, normoactive bowel sounds. Musculoskeletal: No edema, cyanosis, or  clubbing. Neuro: Alert, answering all questions appropriately. Cranial nerves grossly intact. Skin: No rashes or petechiae noted. Psych: Normal affect. Lymphatics: No cervical, calvicular, axillary or inguinal LAD.    Penicillin: Rash, Tachycardia, Chest Tightness  Shellfish: Itching, Rash, Hives, Chest Tightness  Laboratory Results: Routine Chem:  05-Jul-13 05:27    Result Comment WBC - NOTIFIED OF CRITICAL VALUE  - RESULTS VERIFIED BY REPEAT TESTING.  - CALLED TO BROOKE ROBERTSON@0639 ,07/06/11  - READ-BACK PROCESS PERFORMED.  - TPL  Result(s) reported on 06 Jul 2011 at 06:41AM.   Lipase 373 (Result(s) reported on 06 Jul 2011 at 06:41AM.)  Routine Hem:  05-Jul-13 05:27    Hemoglobin (CBC)  7.0   WBC (CBC)  1.8   RBC (CBC)  2.72   Hematocrit (CBC)  20.8   Platelet Count (CBC) 246   MCV  77   MCH  25.7   MCHC 33.6   RDW 12.9   Neutrophil % 62.1   Lymphocyte % 24.1   Monocyte % 11.0   Eosinophil % 2.2   Basophil % 0.6   Neutrophil #  1.1   Lymphocyte #  0.4   Monocyte # 0.2   Eosinophil # 0.0   Basophil # 0.0   Assessment and Plan:  Impression:   Rectal cancer on chemotherapy, admitted for intractable nausea and vomiting, anemia and neutropenia.    Plan:   1.  Rectal cancer: Patient received treatment with FOLFOX on June 26, 2011.  Her next infusion was scheduled to be on July 10, 2011 but this  will likely need to be postponed until her acute symptoms as well as cytopenias resolve. Neutropenia: Secondary chemotherapy.  If her white blood cell count does not improve over the next 1-2 days, will initiate daily Neupogen.  Continue neutropenic precautions. Anemia: Also likely secondary to chemotherapy.  Agree with blood transfusion.  Continue to monitor if patient's hemoglobin falls below 7.0 consider additional packed red blood cells. Nausea and vomiting: Continue Zofran and Phenergan as needed.  consult, will follow.   Electronic Signatures: Gerarda FractionFinnegan, Alandra Sando (MD)   (Signed 05-Jul-13 16:46)  Authored: HISTORY OF PRESENT ILLNESS, PFSH, ROS, NURSING NOTES, PE, ALLERGIES, LABS, ASSESSMENT AND PLAN   Last Updated: 05-Jul-13 16:46 by Gerarda FractionFinnegan, Juliette Standre (MD)

## 2014-04-25 NOTE — Op Note (Signed)
PATIENT NAME:  Laura Barajas, Laura Barajas MR#:  [id number] DATE OF BIRTH:  08-07-1955  DATE OF PROCEDURE:  05/22/2011  PREOPERATIVE DIAGNOSIS: Low rectal cancer.  POSTOPERATIVE DIAGNOSIS:  Low rectal cancer.   SURGEON: Claude MangesWilliam F Brodie Correll, M.D.   FIRST ASSISTANT: Natale LayMark Bird, M.D.   ANESTHESIA: General.  PROCEDURE: 1. Low anterior resection.  2. Diverting loop ileostomy.  3. Incidental appendectomy.   PROCEDURE IN DETAIL: The patient was placed in the lithotomy position and prepped and draped in the usual sterile fashion. A lower midline incision was made and carried down through the linea alba and the peritoneum was entered carefully. Manual palpation of the liver failed to reveal any liver masses and there was no evidence of carcinomatosis or peritoneal studding. The white line of Toldt of the distal sigmoid colon was taken down and the peritoneal peritoneum was divided down along the edge of the peritoneal reflection in the pelvis and then across between the rectum and the cervix. The peritoneum was divided on the right side at the base of the rectosigmoid mesocolon as well. A suitable location in the mid sigmoid colon was selected where there was an excellent proximal blood supply and the sigmoid colon was divided with the GIA stapling device here and the sigmoid mesocolon was taken down with a combination of the Harmonic scalpel and 2-0 silk ligatures. This was taken down to the sacral promontory and at the level of the sacral promontory a total mesorectal excision plane was entered with electrocautery and a total mesorectal excision was affected all the way to the coccyx. The middle hemorrhoidal vessels were controlled with large hemoclips and the Harmonic scalpel and the anterior dissection allowed the distal rectum to be dissected off of the vagina to an area that was a few centimeters distal to the tumor which was still palpable. Here quite low in the pelvis the rectum was divided with the contour  stapling device. The proximal staple line on the mid sigmoid colon was then opened and the proximal sigmoid colon would easily admit a 33-mm EEA sizer, but a 29-mm anvil was introduced through the colotomy and brought out through the antimesenteric tenia coli proximal to the colotomy. The colotomy was closed with another firing of the GIA stapling device such that the pouch between the anvil and the GIA staple line was only a centimeter or two. The anus was then dilated and first a 25-mm EEA sizer was introduced and then a 29-mm EEA sizer was introduced, and when that occurred the contour staple line opened in its midportion. Therefore, a 3-0 Prolene full thickness pursestring suture was placed in the opening of the distal rectum, and in addition a horizontal mattress suture of 3-0 Prolene was placed on one side of the staple line which was still intact, and a full-thickness suture of 3-0 Prolene was placed on the other side of the staple line that was still intact. Then the EEA stapler was introduced transanally with the spike coming through the proctotomy and the pursestring suture was tied down on top of the spike. This was connected to the anvil in the mid sigmoid colon and it was closed and fired. It was withdrawn and seen to contain two complete doughnut rings. The staple line was extremely close to the vaginal wall, but was not thought to have included the vaginal wall. Prior to placing this, hemostasis was assured within the pelvis and although there had been some bleeding from the presacral venous plexus, this was controlled with 2-0 silk  suture ligatures and Surgicel, which was removed prior to the completion of the procedure. The pelvis was then irrigated with warm normal saline and the normal saline was left in the pelvis and I clamped the proximal sigmoid colon manually and the assistant forcibly placed air transanally to the area of the anastomosis and there was no air leak.  An incidental  appendectomy was then undertaken by taking down the mesoappendix with the Harmonic scalpel and dividing the appendix off of the cecal base, with a GIA stapling device. A suitable location for a diverting loop ileostomy was found about 10 or 15 cm proximal to the ileocecal valve, and after all of the sponges were removed and the peritoneum was irrigated again with copious warm normal saline and the surgeon's gloves were changed, this area of distal ileum was brought over towards the right side of the abdomen and the omentum was draped over top of the remainder of the small intestine and down into the true pelvis underneath the anastomosis such that the pelvis would be filled with omentum and would not allow any small intestine to come into it. The uterus was then placed over top of the omentum to the right side of the distal sigmoid colon. A suitable location for a diverting loop ileostomy was found on the right side of the abdominal wall and a cylinder of skin and subcutaneous tissue was excised and a longitudinal incision was made in the anterior rectus sheath. The rectus muscle was opened in the direction of its fibers and the posterior rectus sheath was also opened longitudinally. The defect in the abdominal wall would admit two of my fingers. The distal ileum was then drawn through this oriented such that the proximal or functional side would be superior and slightly medial, and the mucous fistula side would be inferior and slightly lateral. The linea alba was then closed with a running #1 PDS suture. The subcutaneous tissue was irrigated and again hemostasis was ensured, and the skin was reapproximated with a skin stapling device. This area was covered while the diverting loop ileostomy was matured.   A transverse ileotomy was created and full thickness ileum to full thickness edge of skin anastomosis was performed in order to mature the ostomy with interrupted 3-0 Monocryl sutures. An ostomy appliance was  applied to this and a sterile dressing was applied to the midline incision, completing the procedure. At the conclusion of the procedure I performed a digital rectal examination and ascertained that the anastomosis was 5-cm proximal to the anal verge. It felt widely patent and was not bleeding. I also examined the vagina and there was no area of mucosal injury to the vagina. There was one single area that was slightly puckered that was thought to be due to the previously placed pursestring suture. The patient tolerated the procedure well. There were no complications.    ____________________________ Claude Manges, MD wfm:bjt D: 05/22/2011 11:42:27 ET T: 05/22/2011 12:18:29 ET JOB#: [id number]  cc: Claude Manges, MD, <Dictator> Claude Manges MD ELECTRONICALLY SIGNED 05/23/2011 9:22

## 2014-04-25 NOTE — Discharge Summary (Signed)
PATIENT NAMBurney Barajas:  Barajas, Laura MR#:  [id number] DATE OF BIRTH:  Aug 13, 1955  DATE OF ADMISSION:  07/05/2011 DATE OF DISCHARGE:  07/09/2011  DISCHARGE DIAGNOSES:  1. Abdominal pain, nausea, vomiting secondary to chemotherapy that is resolving.  2. History of invasive rectal adenocarcinoma, on chemo, followed by Oncology.  3. History of hypertension but hypotensive during hospitalization.  4. Adult onset diabetes.  5. Hypothyroidism.  6. Leukopenia, chemo induced.  7. Anemia, chemo induced.  8. Gastroesophageal reflux disease. 9. Hyperlipidemia.   DISCHARGE MEDICATIONS:  1. Simvastatin 20 mg p.o. at bedtime.  2. Levothyroxine 75 mcg p.o. daily.  3. Omeprazole 20 mg p.o. daily.  4. Ferrous sulfate 325 mg p.o. daily.  5. Promethazine 25 mg p.o. q.6 hours as needed for nausea and vomiting.  6. Zofran 4 mg q.6 hours p.r.n. for nausea and vomiting.  7. Ultram 50 mg p.o. t.i.d. p.r.n. for pain.   MEDICATIONS TO HOLD:  1. Lisinopril. 2. Metformin.   CONSULT: Oncology per Dr. Orlie DakinFinnegan    PROCEDURE: The patient was transfused 1 unit packed red blood cell.   PERTINENT LABS PRIOR TO DISCHARGE: White blood cell count 1.6, hemoglobin 8.5, platelets 233, sodium 139, potassium 3.8, creatinine 0.87.    BRIEF HOSPITAL COURSE:  1. Abdominal pain, nausea, vomiting. The patient initially came in with complaints of GI upset secondary to chemo. She was placed on IV fluids, held the metformin. Her symptoms gradually improved. Pain was controlled with Ultram, continued on her PPI. Plan is to hold on her blood pressure medications at this time as she was hypotensive and her blood pressure appears stable off of blood pressure medications.  2. Adult onset diabetes. Will hold off on the metformin because of her GI symptoms.  3. Invasive rectal adenocarcinoma. She needs to follow-up with Oncology as scheduled tomorrow at 9 a.m. Chemo potentially may be held secondary to her acute symptoms.  4. Anemia and  leukopenia secondary to chemotherapy. This will be followed by Oncology.   DISPOSITION: She is in stable condition to be discharged to home.   FOLLOW-UP:  1. Follow-up with Oncology tomorrow as planned at 9 a.m.  2. Follow-up with Dr. Burnadette Barajas within 14 days.   ____________________________ Laura Laura Braydee Shimkus, MD kl:drc D: 07/09/2011 08:34:56 ET T: 07/10/2011 10:23:44 ET JOB#: [id number]  cc: Laura Laura Gurpreet Mikhail, MD, <Dictator> Gerome SamJanak K. Doylene Canninghoksi, MD Tollie Pizzaimothy J. Orlie DakinFinnegan, MD  Laura Laura Crystalina Stodghill MD ELECTRONICALLY SIGNED 07/16/2011 16:41

## 2014-04-25 NOTE — Discharge Summary (Signed)
PATIENT NAMBurney Gauze:  Barajas, Laura MR#:  [id number] DATE OF BIRTH:  10/17/1955  DATE OF ADMISSION:  05/22/2011 DATE OF DISCHARGE:  05/28/2011  PRINCIPLE DIAGNOSIS: Stage ypT3 ypN0 M0 invasive, moderately differentiated adenocarcinoma of the rectum, R0.   PRINCIPLE PROCEDURE PERFORMED DURING THIS ADMISSION: Low anterior resection with diverting loop ileostomy and incidental appendectomy.   HOSPITAL COURSE: Patient underwent the above-mentioned procedure for the above-mentioned diagnosis and did very well postoperatively where her pain was managed with p.o. medications, her diet was advanced, her ileostomy output was good. She was seen by the wound ostomy nurse and discharged home on postoperative day six. She was asked to make an appointment to see me in one week for staple removal and home health nursing was arranged. The patient had negative margins with the closest margin being the radial margin and there was a 3.8 cm negative distal margin obtained and there were 20 lymph nodes in the specimen none of which contained tumor.    ____________________________ Claude MangesWilliam F. Marc Leichter, MD wfm:cms D: 06/01/2011 20:32:10 ET T: 06/02/2011 13:13:59 ET JOB#: [id number]  cc: Claude MangesWilliam F. Zaydyn Havey, MD, <Dictator> Claude MangesWILLIAM F Atiyana Welte MD ELECTRONICALLY SIGNED 06/04/2011 21:45

## 2014-04-25 NOTE — Op Note (Signed)
PATIENT NAME:  Laura Barajas, Laura Barajas MR#:  [id number] DATE OF BIRTH:  03/21/1955  DATBurney Gauze OF PROCEDURE:  01/16/2011  PREOPERATIVE DIAGNOSIS: Rectal cancer.   POSTOPERATIVE DIAGNOSIS: Rectal cancer.   PROCEDURE: Insertion of central venous line with subcutaneous infusion port.   SURGEON: Adella HareJ. Wilton Smith, MD   ANESTHESIA: Local 1% Xylocaine with monitored anesthesia care.   INDICATIONS: This 59 year old female has recent findings of rectal cancer now needing central venous access for chemotherapy.   DESCRIPTION OF PROCEDURE: The patient was placed on the operating table in the supine position under light intravenous sedation. A rolled sheet was placed behind the shoulder blades. Her head was placed on a doughnut ring so that the neck was extended and head turned 20 degrees to the left. The neck and right subclavian areas were prepared with ChloraPrep and draped in a sterile manner.   The skin beneath the clavicle was infiltrated with 1% Xylocaine. A transversely oriented 3 cm incision was made, carried down through subcutaneous tissues. A subcutaneous pouch was created just anterior to the deep fascia inferior to the incision large enough to admit the PFM port. The patient was placed in Trendelenburg position and used ultrasound to demonstrate the jugular vein on the right side. The carotid artery was demonstrated as well. The skin overlying the jugular vein was infiltrated with 1% Xylocaine. A transversely oriented 5 mm incision was made. A needle was inserted into the jugular vein using ultrasound guidance. An ultrasonic image was recorded. The guidewire was passed down into the vena cava. The needle withdrawn. Fluoroscopy was used to demonstrate position of the guidewire in the vena cava. The dilator and introducer sheath were advanced over the guidewire. The dilator and guidewire were removed. The catheter was advanced down through the sheath and the sheath peeled away. Fluoroscopy was used to position  the catheter in the superior vena cava and an x-ray image was recorded and placed on the paper chart which demonstrated presence of the catheter in the vena cava. Next, the catheter was tunneled down to the subclavian port site, pressure held over the tunnel site. The catheter was cut to fit and attached to the PFM port using the accompanying security device to hold it in place. The port was accessed with a Huber needle, aspirated a trace of blood, and then flushed with 5 mL of heparinized saline solution. The port was placed into the subcutaneous pouch and sutured to the deep fascia with 4-0 silk. The pouch was closed with 5-0 Vicryl. Both skin incisions were closed with 5-0 Vicryl subcuticular suture and Dermabond. The patient tolerated surgery satisfactorily and is now being prepared for transfer to the recovery room.  ____________________________ Shela CommonsJ. Renda RollsWilton Smith, MD jws:drc D: 01/16/2011 14:59:00 ET T: 01/16/2011 15:13:26 ET JOB#: [id number]  cc: Adella HareJ. Wilton Smith, MD, <Dictator> Adella HareWILTON J SMITH MD ELECTRONICALLY SIGNED 02/04/2011 15:10

## 2014-04-25 NOTE — H&P (Signed)
PATIENT NAME:  Laura Barajas, VESSEY MR#:  [id number] DATE OF BIRTH:  [birth year]  DATE OF ADMISSION:  07/05/2011  REFERRING PHYSICIAN:  Pamala Duffel, MD    PRIMARY CARE PHYSICIAN: Marisue Ivan, MD  ONCOLOGIST: Johney Maine, MD   PRIMARY SURGEON: Claude Manges, MD   CHIEF COMPLAINT: Nausea, vomiting, abdominal pain.   HISTORY OF PRESENT ILLNESS: The patient is an unfortunate 59 year old female diagnosed with rectal carcinoma, status post low anterior resection with diverting loop ileostomy and incidental appendectomy on 05/22/2011. The patient is currently on chemotherapy with Dr. Doylene Canning and finished her radiation therapy. The patient is from [place] and does not speak English, but her daughter is at bedside, so most of the history is obtained from her. The patient has been having poor appetite for the last few days and has been complaining of nausea and vomiting for the last four days. The patient finished her recent chemotherapy on 06/28/2011. A couple of days after that she started having these complaints and could not tolerate any p.o. intake. As well, she has been complaining of chronic abdominal pain. She denies any chest pain, palpitations, loss of consciousness, altered mental status. The patient had regular follow-up with the Surgical Service to follow up on the ileostomy  where the daughter said she was told everything was fine, but she was instructed to come to the ED for evaluation secondary to her nausea and vomiting and poor appetite. In the ED, the patient was found to be orthostatic and to be in acute renal failure, hypovolemic, and to have hyponatremia as well. The patient had recent labs which did show hyponatremia, but it had significant drop from 125 on June  25th to 115 today.   PAST MEDICAL HISTORY:   1. Hypertension.  2. Hypothyroidism.  3. Gastroesophageal reflux disease.  4. Anemia.  5. Hyperlipidemia.  6. Invasive rectal adenocarcinoma diagnosed in 2012, followed by  Dr. Doylene Canning, status post inferior resection May 21st of this year.  7. Diabetes mellitus.   PAST SURGICAL HISTORY:  1. Thyroidectomy.  2. Low anterior resection with diverting loop ileostomy and incidental appendectomy May 21st.   MEDICATIONS:  1. Ferrous sulfate 325 mg daily.  2. Levothyroxine 75 mcg daily.  3. Lisinopril 40 mg daily.  4. Metformin 1000 mg daily.  5. Omeprazole 20 mg daily.  6. Promethazine 25 mg as needed every 6 hours.  7. Simvastatin 20 mg daily.  8. Vicodin 1 to 2 tablets every 4 hours as needed   ALLERGIES: Penicillin.   FAMILY HISTORY: Father died in his 82s. Mother died in her 31s because of stomach cancer. She has four sisters and three brothers. No heart disease or cancer.   SOCIAL HISTORY: The patient is divorced, lives with daughter. No history of tobacco, alcohol, or drug use.   REVIEW OF SYSTEMS: CONSTITUTIONAL: Denies any fever. Complains of fatigue and weakness. EYES: Denies blurry vision, double vision. ENT: Denies tinnitus, ear pain, hearing loss. RESPIRATORY: Denies cough, wheezing, hemoptysis, dyspnea. CARDIOVASCULAR: Denies chest pain, orthopnea, edema, arrhythmia, or palpitations. GASTROINTESTINAL: Complains of nausea, vomiting, abdominal pain. GENITOURINARY: Denies dysuria, hematuria, renal colic. ENDOCRINE: Denies polyuria, polydipsia, or heat or cold intolerance. Has hypothyroidism. HEMATOLOGY: Has anemia. Denies easy bruising or any bleeding diathesis. INTEGUMENTARY: Denies any acne, rash, or itching. MUSCULOSKELETAL: Denies any neck pain, shoulder pain, back pain. NEUROLOGICAL:   Denies any numbness, dysarthria, epilepsy, tremors. PSYCHIATRIC: Denies any anxiety. Has complaints of insomnia. Denies any schizophrenia or nervousness.    PHYSICAL EXAMINATION:  VITAL SIGNS:  Temperature 98.1, pulse 82, respiratory rate 20, blood pressure 115/61, saturating 100% on room air.   GENERAL: A malnourished female who is comfortable in bed in no apparent  distress.   HEENT: Head atraumatic, normocephalic. Pupils are equal and reactive to light. Pink conjunctivae. Anicteric sclerae. Moist oral mucosa.   NECK: Supple. No thyromegaly. No JVD. Has thyroidectomy surgical scar in the lower neck area.   CHEST: Good air entry bilaterally. No wheezing, rales, rhonchi.   CARDIOVASCULAR: S1, S2 heard. No rubs, murmur, or gallops.   ABDOMEN: Scaphoid, soft, nontender, has a midline surgical scar which is healed nicely. Had right ileostomy with clear liquid output in the bag. No bleeding or discharge noticed.  EXTREMITIES: No edema. No clubbing. No cyanosis.   SKIN: Warm and dry, decreased skin turgor. No rash.   PSYCHIATRIC: Appropriate affect. Awake, alert and oriented x3. Intact judgment and insight.    NEUROLOGICAL: Cranial nerves are grossly intact. Motor 5/5. Sensation symmetrical.   PERTINENT LABORATORY DATA: Glucose 96, BUN 23, creatinine 1.64, sodium 115, potassium 5, chloride 79, CO2 25, lipase 488. White blood cells 4.3, hemoglobin 10.4, hematocrit 3.4, platelets 333. Urinalysis is negative.   ASSESSMENT AND PLAN: The patient is a 59 year old female with a recent diagnosis of rectal cancer, status post low anterior resection and diverting loop ileostomy, on chemotherapy, presents with nausea, vomiting, abdominal pain, developed acute renal failure, hyponatremia secondary to dehydration.   1. Nausea, vomiting, abdominal pain: This is most likely due to recent chemotherapy, but the patient had mildly elevated lipase so pancreatitis is a possibility. I will check right upper quadrant and pancreas ultrasound. I will continue with aggressive IV fluid hydration and will repeat lipase level in the a.m. I will start her on a clear liquid diet and advance if she tolerates. I am going to continue with pain medications p.r.n., and Zofran p.r.n., and Phenergan for nausea.  2. Acute renal failure: This is secondary to dehydration. We will discontinue  metformin and lisinopril. We will have her on IV normal saline 100 mL/h after she received a total of 2 liters in the ED, and we will recheck BMP in the a.m.  3. Hyponatremia: As well, this is due to hyponatremia and significant vomiting. I will continue with IV normal saline as well, and will recheck BMP in the a.m., and will recheck sodium level again at 4:00 p.m. tomorrow.  4. Diabetes mellitus: I will discontinue metformin. I will continue with insulin sliding scale.  5. Hypertension: The patient is orthostatic. We will hold medications.  6. Hypothyroidism: Continue with Synthroid.  7. Gastroesophageal reflux disease: Continue with Protonix.  8. Anemia: Continue with iron.  9. Continue with subcutaneous heparin for deep vein thrombosis prophylaxis and Protonix for GI prophylaxis.   CODE STATUS:  FULL CODE.     TOTAL TIME SPENT ON PATIENT CARE: 55 minutes.   ____________________________ Starleen Armsawood S. Yaseen Gilberg, MD dse:cbb D: 07/05/2011 03:39:59 ET T: 07/05/2011 14:50:51 ET JOB#: [id number]  cc: Starleen Armsawood S. Barabara Motz, MD, <Dictator> Marisue IvanKanhka Linthavong, MD Laresa Oshiro Teena IraniS Kelin Borum MD ELECTRONICALLY SIGNED 07/14/2011 0:17

## 2014-04-25 NOTE — Consult Note (Signed)
Patient's white blood cell count is improving.  No need for Neupogen at this time.  Hemoglobin also improved with transfusion.  No further transfusions are indicated at this point.  If patient is discharged, please be sure she keeps her followup appointment on July 10, 2011 at 9 AM.  She is scheduled for chemotherapy on this day, but this will likely need to be delayed until her acute symptoms resolve. consult, call with questions.  Electronic Signatures: Gerarda FractionFinnegan, Timothy (MD)  (Signed on 06-Jul-13 11:21)  Authored  Last Updated: 06-Jul-13 11:21 by Gerarda FractionFinnegan, Timothy (MD)

## 2014-04-25 NOTE — Consult Note (Signed)
Reason for Visit: This 59 year old Female patient presents to the clinic for initial evaluation of  Rectal cancer .   Referred by Dr. Doylene Canning.  Diagnosis:   Chief Complaint/Diagnosis 59 year old [place] female with clinical stage IIa (T3, N0, M0) adenocarcinoma of the rectum.    Pathology Report Pathology report reviewed    Imaging Report CT scan and PET CT scan reviewed    Referral Report Clinical notes reviewed    Planned Treatment Regimen Neoadjuvant chemoradiation prior to surgery    HPI Patient is a 59 year old [place] female who presented with rectal pain for approximately one year. She also had a 20 pound weight loss and rectal bleeding intermittently for the past 3 months. She was seen by GI underwent upper and lower endoscopy. He mid rectal lesion was found biopsy positive for moderately differentiated adenocarcinoma. PET CT scan was performed showing no evidence of distant disease avid uptake in the mid rectum corresponding to the known tumor. She also had an endoscopic ultrasound by Dr. Christella Hartigan showing at least a T3 lesion. No pericolonic nodes were noted on endoscopic ultrasound. She is doing fairly well. Still having rectal pain. She is scheduled for Port-A-Cath placement she has been referred to radiation oncology for consideration of neoadjuvant chemotherapy and radiation. She is seen today accompanied by her daughter and a interpreter.   Past Hx:    Rectal Cancer:    Diabetic:    Hypertension:    Tubal Ligation 1988:    Thyroidectomy 1988:   Past, Family and Social History:   Past Medical History positive    Cardiovascular hyperlipidemia; hypertension    Gastrointestinal GERD    Endocrine diabetes mellitus    Past Surgical History Thyroidectomy, tubal ligation    Family History positive    Family History Comments Family history of colon cancer    Social History noncontributory    Additional Past Medical and Surgical History Accompanied by daughter today  and interpreter   Allergies:   Penicillin: Unknown  Home Meds:  Home Medications:  traMADol 50 mg tablet: 1 tab(s) orally every 4 hours x 10 days, As Needed- for Pain , Active  Synthroid 100 mcg (0.1 mg) oral tablet: tab(s) orally once a day, Active  lisinopril 40 mg oral tablet: 1 tab(s) orally once a day, Active  simvastatin 20 mg oral tablet: 1 tab(s) orally once a day (at bedtime), Active  omeprazole 20 mg oral delayed release capsule: 1 cap(s) orally once a day, Active  metformin 1000 mg oral tablet: 1 tab(s) orally once a day (at bedtime), Active  Review of Systems:   General negative    Performance Status (ECOG) 0    Skin negative    Breast negative    Ophthalmologic negative    ENMT negative    Respiratory and Thorax negative    Cardiovascular negative    Gastrointestinal see HPI    Genitourinary negative    Musculoskeletal negative    Neurological negative    Psychiatric negative    Hematology/Lymphatics negative    Endocrine negative    Allergic/Immunologic negative   Physical Exam:  General/Skin/HEENT:   General normal    Skin normal    Eyes normal    ENMT normal    Head and Neck normal    Additional PE Well-developed thin female in NAD. Lungs are clear to A&P cardiac examination shows regular rate and rhythm. Abdomen is benign. Positive bowel sounds all 4 quadrants. Rectal exam was not performed today.   Breasts/Resp/CV/GI/GU:  Respiratory and Thorax normal    Cardiovascular normal    Gastrointestinal normal    Genitourinary normal   MS/Neuro/Psych/Lymph:   Musculoskeletal normal    Neurological normal    Lymphatics normal   Other Results:  Radiology Results: CT:    04-Dec-12 10:52, CT Abdomen and Pelvis With Contrast   CT Abdomen and Pelvis With Contrast    REASON FOR EXAM:    Pat allergic to crab meat contrast anemia rectal   bleeding weight loss ...  COMMENTS:       PROCEDURE: CT  - CT ABDOMEN / PELVIS  W  - Dec 05 2010 10:52AM     RESULT:     History: Rectal bleeding and weight loss.    Comparison Study: No prior.    Findings: Standard CT obtained with 50 ml of Isovue 300. The liver is   normal. Spleen is normal. Pancreas is normal. A 1.6 cm right adrenal   nodular lesion is present. This could represent adenoma. Metastatic     disease cannot be excluded. MRI of the adrenal is suggested for further   evaluation. Gallbladder is nondistended. No biliary distention. The   bladder is nondistended. No hydronephrosis is noted. Prominent left   ovarian vein is noted. Stool is noted in the colon. There is rectal wall   thickening for which clinical evaluation is suggested. Inflammatory or   malignant change could present in this fashion. There is no evidence of   bowel obstruction or free air. The aorta is nondistended. Portal vein and   mesenteric vessels are patent.    IMPRESSION:      1. Rectal wall thickening. Clinical correlation suggested. Direct   visualization should be considered to evaluate for inflammatory or   malignant change in the rectum.   2. Right adrenal lesion for which MRIof the adrenal is suggested for     further evaluation.      Thank you for the opportunity to contribute to the care of your patient.           Verified By: Gwynn BurlyHOMAS E. REGISTER, M.D., MD  Nuclear Med:    19-Dec-12 11:13, PET/CT Scan Colorectal Cancer Initial Stage   PET/CT Scan Colorectal Cancer Initial Stage    REASON FOR EXAM:    Rectal Mass  COMMENTS:       PROCEDURE: PET - PET/CT INIT STAGING COLORECTAL  - Dec 20 2010 11:13AM     RESULT: The patient is undergoing initial staging of colonic malignancy.   The patient's fasting blood glucose level was 115 mg/dL. The patient   received 11.96 mCi of F-18 labeled FDG at 9:49 a.m. with scanning   beginning at 10:49 a.m. A noncontrast CT scan was performed at the same   sitting for coregistration and attenuation correction.    Uptake of the  radiopharmaceutical within the neck is within the limits of   normal. Within the thorax I see no abnormal uptake of the   radiopharmaceutical. Within the abdomen uptake within the liver is   normal. Normally expected uptake in the kidneys and ureters and urinary     bladder is seen. There is minimally increased uptake noted within   portions of the descending colon. A small amount of increased uptake is   noted in the right colon and in the proximal rectosigmoid. However, there   is significantly increased uptake associated with the distal sigmoid and   proximal rectum. Here there is eccentric wall thickening. The maximal  SUV   is 10.8 with a mean of 8.4. I do not see abnormal uptake within pelvic or   inguinal lymph nodes.    The CT images reveal the lungs tobe clear. There is no pleural nor   pericardial effusion. I see no bulky mediastinal or hilar lymph nodes.   Within the abdomen there are no adrenal masses. The liver and gallbladder   and pancreas and spleen appear normal. The kidneys exhibit no evidence of   stones nor of obstruction. The small bowel exhibits no acute abnormality.   There is no abdominal aortic aneurysm. Within the posterior aspect of the   pelvis posteriorly eccentric wall thickening of the distal sigmoid is     demonstrated best on image 300. The uterus and adnexal structures are   grossly normal.    IMPRESSION:   1. There is abnormal uptake of the radiopharmaceutical in the distal   sigmoid at the site of known rectosigmoid mass.  2. I see no findings within the neck or chest or abdomen or pelvis to   suggest metastatic disease.          Verified By: DAVID A. [place], M.D., MD   Assessment and Plan:   Impression At least clinical stage IIa adenocarcinoma of the mid rectum a 59 year old female.    Plan At this time I recommend neoadjuvant chemotherapy and radiation therapy. We will treat her pelvis up to 4500 cGy and try to down stage her tumor prior to  surgery. Would also boost another 1000 cGy as possible according to [facility] guidelines for radiation therapy in the neoadjuvant setting for rectal cancer. Risks and benefits of treatment including diarrhea were discussed with the patient through the interpreter. Consent was signed. I have set her up for CT simulation early next week. We will coordinate chemotherapy with Dr. Aleda Grana staff.  I would like to take this opportunity to thank you for allowing me to continue to participate in this patient's care.   Electronic Signatures: Rebeca Alert (MD)  (Signed 04-Jan-13 11:49)  Authored: HPI, Diagnosis, Past Hx, PFSH, Allergies, Home Meds, ROS, Physical Exam, Other Results, Encounter Assessment and Plan   Last Updated: 04-Jan-13 11:49 by Rebeca Alert (MD)

## 2015-02-20 IMAGING — CR DG CHEST 1V PORT
1 series · 1 of 1 positions shown · non-contrast
Comparison: none

REASON FOR EXAM: sob
COMMENTS:

[ap]
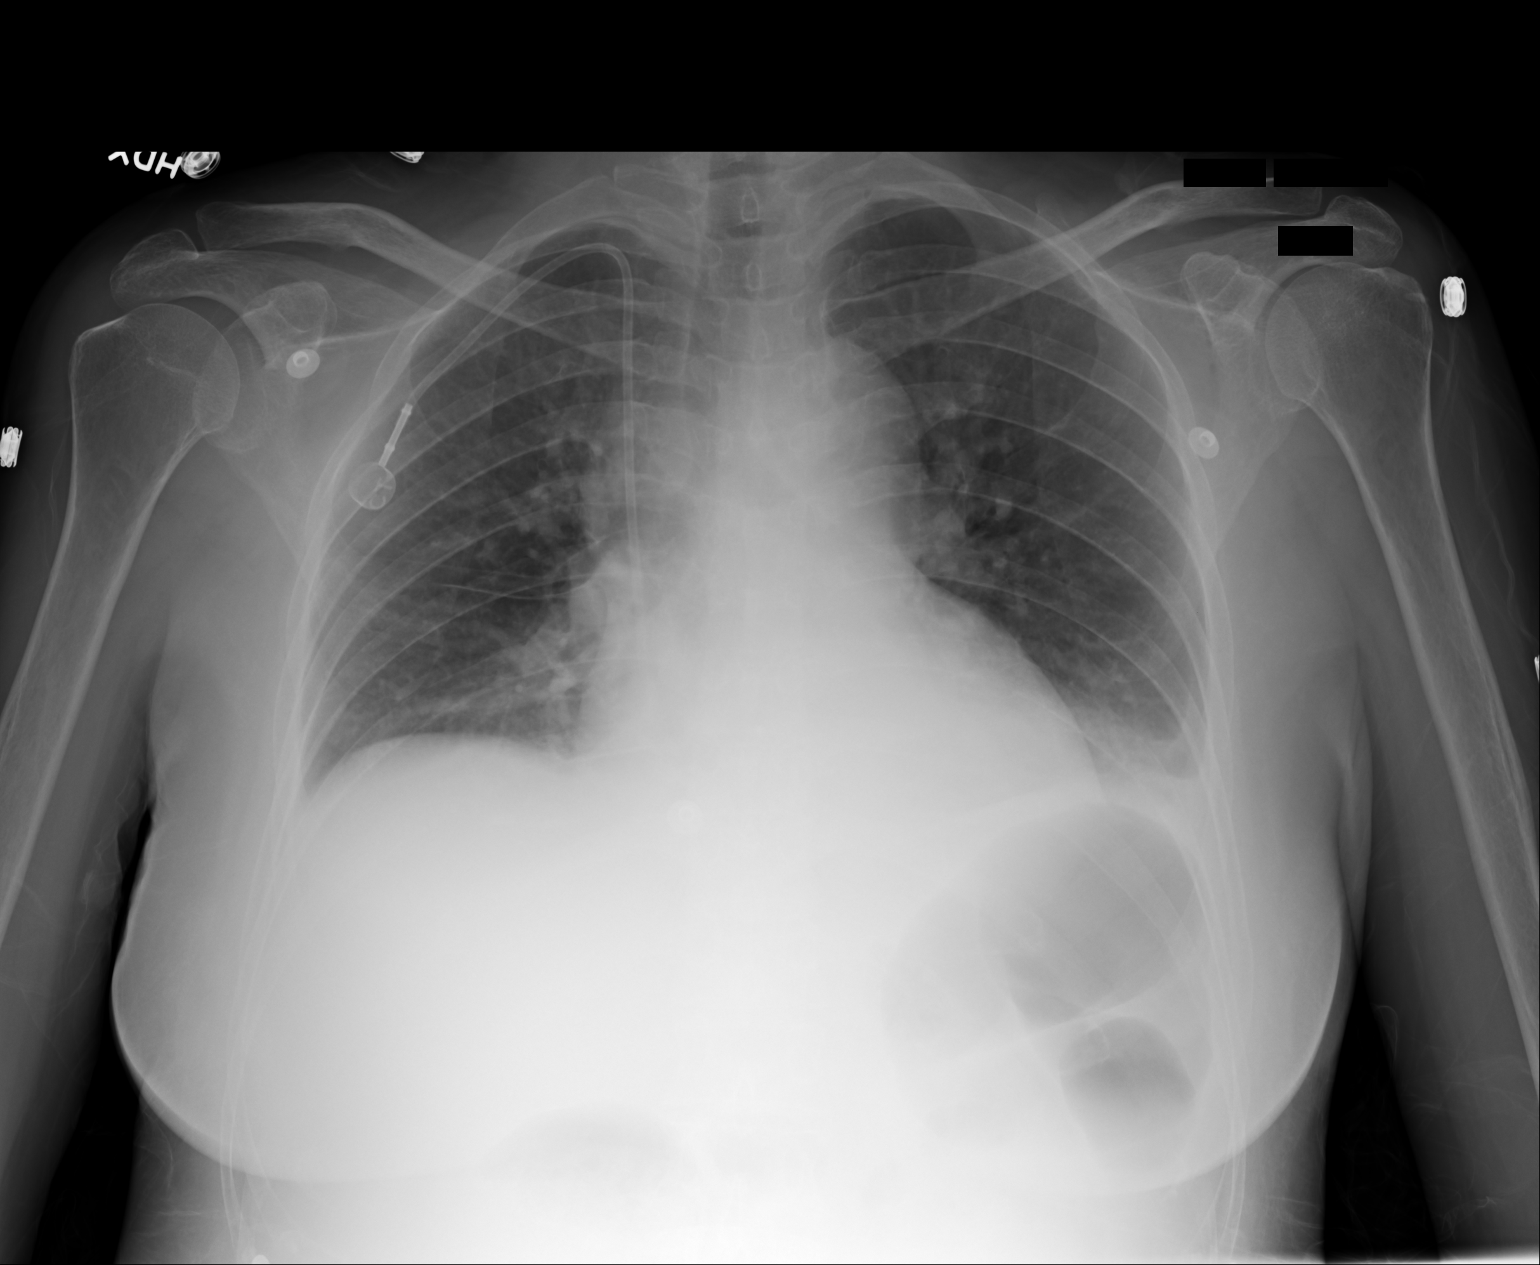

[1 of 1 positions shown; findings below may reference images not displayed]

PROCEDURE:     DXR - DXR PORTABLE CHEST SINGLE VIEW  - May 04, 2012  [DATE]

RESULT:     The lungs are hypoinflated. There is basilar atelectasis
bilaterally but most conspicuously on the left. A Port-A-Cath appliance is
in place. The cardiac silhouette is normal in size. The pulmonary
vascularity is not engorged.
IMPRESSION: The study is limited due to hypoinflation. Bibasilar
atelectasis is suspected.

[REDACTED]

## 2015-02-24 IMAGING — CR DG CHEST 2V
1 series · 2 of 2 positions shown · non-contrast
Comparison: none

REASON FOR EXAM: post-op fever
COMMENTS:

[Series 1: pa · 0.17mm/px · 2 of 2 slices shown]
[im 1/2]
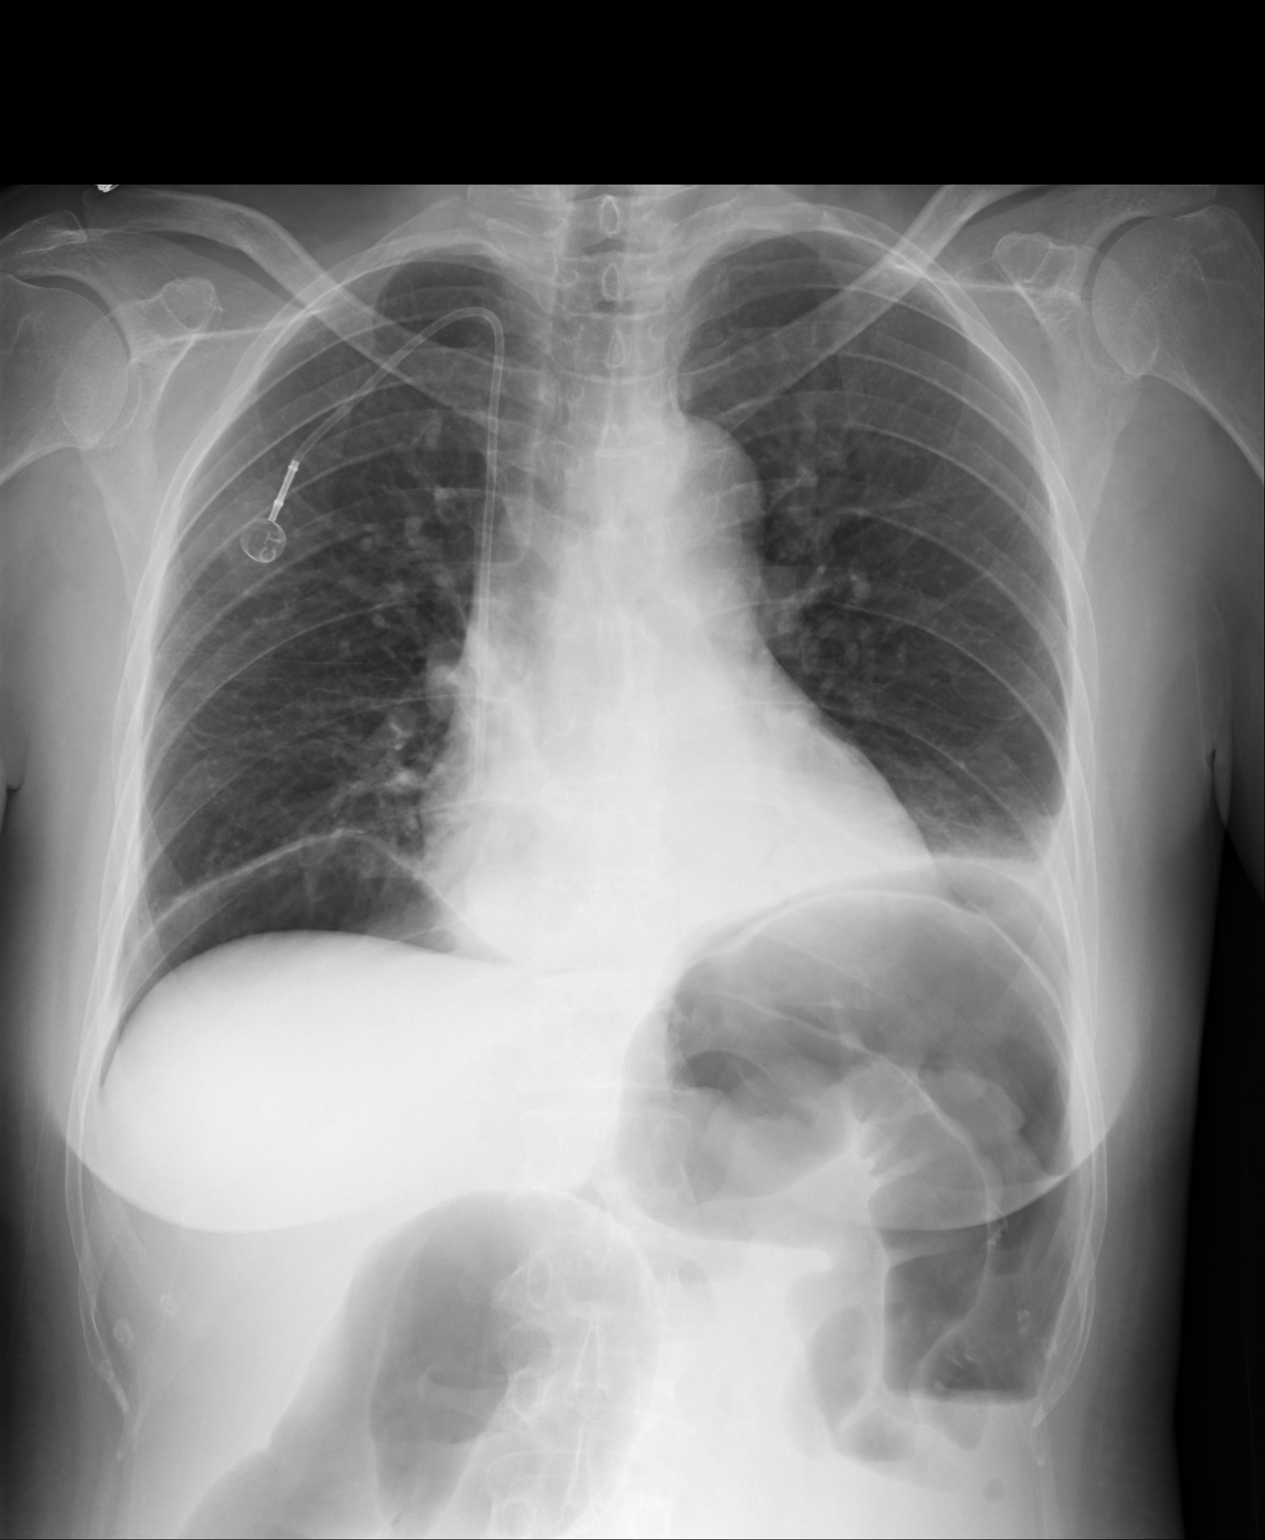
[im 2/2]
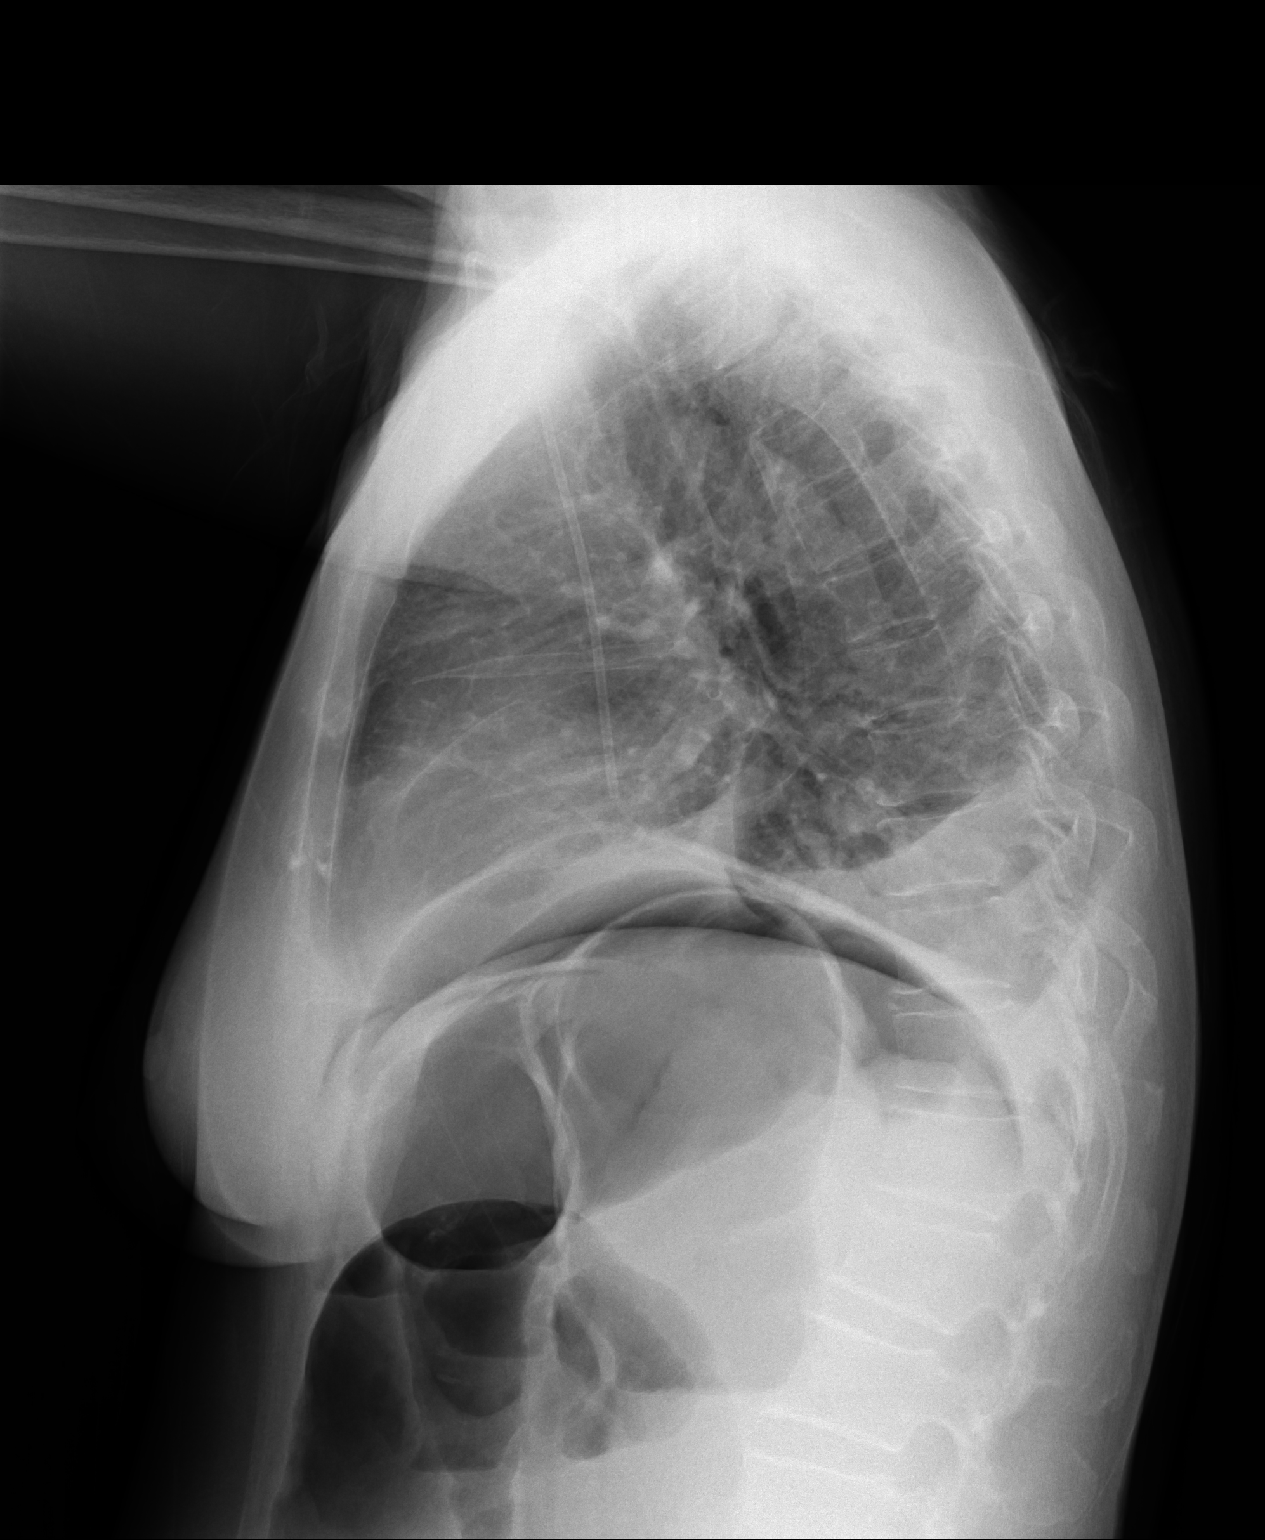

[2 of 2 positions shown; findings below may reference images not displayed]

PROCEDURE:     DXR - DXR CHEST PA (OR AP) AND LATERAL  - May 08, 2012 [DATE]

RESULT:     Comparison is made to the study of for May 2012.

Is increased density in the lung base on the left with blunting of the left
costophrenic angle and a small left pleural effusion with some left lung
base atelectasis likely present. There is a band of atelectasis at the right
lung base. Subdiaphragmatic free air is present. The patient has undergone
recent surgery. There is some air within moderately prominent loops of bowel
in the upper abdomen. The cardiac silhouette is normal. There is a
right-sided central venous catheter with Port-A-Cath present. The tip of the
catheter is in the right atrium.
IMPRESSION: 1. Small left pleural effusion with some presumed left lung base atelectasis.
2. Subdiaphragmatic free air which is likely secondary to recent surgery.
Correlate clinically.

[REDACTED]
